# Patient Record
Sex: Male | Born: 1980 | Race: White | Hispanic: No | Smoking: Former smoker
Health system: Southern US, Community
[De-identification: ages and names within clinical notes are randomized; demographics above are authoritative.]

## PROBLEM LIST (undated history)

## (undated) DIAGNOSIS — I8393 Asymptomatic varicose veins of bilateral lower extremities: Secondary | ICD-10-CM

## (undated) DIAGNOSIS — G43909 Migraine, unspecified, not intractable, without status migrainosus: Secondary | ICD-10-CM

## (undated) DIAGNOSIS — I10 Essential (primary) hypertension: Secondary | ICD-10-CM

## (undated) HISTORY — PX: HERNIA REPAIR: SHX51

## (undated) HISTORY — PX: APPENDECTOMY: SHX54

---

## 2001-02-04 ENCOUNTER — Other Ambulatory Visit: Admission: RE | Admit: 2001-02-04 | Discharge: 2001-02-04 | Payer: Self-pay | Admitting: Internal Medicine

## 2002-10-31 ENCOUNTER — Ambulatory Visit (HOSPITAL_COMMUNITY): Admission: RE | Admit: 2002-10-31 | Discharge: 2002-10-31 | Payer: Self-pay | Admitting: Family Medicine

## 2002-10-31 ENCOUNTER — Encounter: Payer: Self-pay | Admitting: Family Medicine

## 2004-05-04 ENCOUNTER — Emergency Department (HOSPITAL_COMMUNITY): Admission: EM | Admit: 2004-05-04 | Discharge: 2004-05-04 | Payer: Self-pay | Admitting: Emergency Medicine

## 2004-08-14 ENCOUNTER — Emergency Department (HOSPITAL_COMMUNITY): Admission: EM | Admit: 2004-08-14 | Discharge: 2004-08-14 | Payer: Self-pay | Admitting: Emergency Medicine

## 2007-10-13 ENCOUNTER — Observation Stay (HOSPITAL_COMMUNITY): Admission: RE | Admit: 2007-10-13 | Discharge: 2007-10-14 | Payer: Self-pay | Admitting: General Surgery

## 2007-10-13 ENCOUNTER — Encounter (INDEPENDENT_AMBULATORY_CARE_PROVIDER_SITE_OTHER): Payer: Self-pay | Admitting: General Surgery

## 2008-06-27 ENCOUNTER — Emergency Department (HOSPITAL_COMMUNITY): Admission: EM | Admit: 2008-06-27 | Discharge: 2008-06-27 | Payer: Self-pay | Admitting: Emergency Medicine

## 2011-01-07 NOTE — Op Note (Signed)
NAME:  Rodney Valdez, [name]               ACCOUNT NO.:  [id number]   MEDICAL RECORD NO.:  [id number]          PATIENT TYPE:  AMB   LOCATION:  DAY                           FACILITY:  [facility]   PHYSICIAN:  Barbaraann Barthel, M.D. DATE OF BIRTH:  [birth year]   DATE OF PROCEDURE:  10/13/2007  DATE OF DISCHARGE:                               OPERATIVE REPORT   SURGEON:  Barbaraann Barthel, M.D.   PREOPERATIVE DIAGNOSIS:  1. Left inguinal hernia.  2. Desire for sterilization.   POSTOPERATIVE DIAGNOSIS:  1. Left inguinal hernia.  2. Desire for sterilization.   PROCEDURE:  1. Left inguinal herniorrhaphy (modified McVay repair without mesh).  2. Bilateral vasectomy.   NOTE:  This is a 30 year old white male who came to my office for a  vasectomy, however, on physical examination he had a rather large left  inguinal hernia that was reducible.  However, was really quite large. I  advised him to take care of both at the same surgery.  We discussed  complications not limited to but including bleeding, infection and the  recurrence of the hernia as well as the possibility of spontaneous  reapproximation of the vas deferens however unlikely that was.  We also  told him that he should not have any sexual activity until we checked a  sperm count.  Informed consent was obtained.   GROSS OPERATIVE FINDINGS:  The patient had a very large left inguinal  hernia with the sigmoid colon within the hernia sac.  Normal vas  deferens on either side.  The vasectomy was performed with the inguinal  incision on the left side, obviously, and through a scrotal incision on  the right.   TECHNIQUE:  The patient was placed in a supine position. After the  adequate administration of LMA anesthesia, his groin area was prepped  with Betadine solution and draped in the usual manner aseptically.  A  Foley catheter was placed.  An incision was carried out between the  anterior superior iliac spine and the pubic tubercle  through skin,  subcutaneous tissue, and Scarpa's layer down to the external oblique  which was opened at the external ring.  The cord structures were then  dissected free.  There was a massive inguinal hernia sac and this  contained the sigmoid colon.  We opened in the sac and reduced the  sigmoid colon back into the peritoneal cavity and then doubly ligated  the sac with 2-0 Bralon and amputated the redundant portion of the sac  with the cautery device.  I then approximated the inguinal floor which  was somewhat weak with 2-0 Bralon suture, the transversus abdominis and  transversalis fascia to Cooper's ligament and Poupart's ligament with  interrupted sutures.  Prior to cinching these tightly, a relaxing  incision was carried out.  Then, attention was turned to the vas  deferens which I identified and removed approximately an inch portion of  which this was ligated on both ends and needle tip cautery device was  placed in then each lumen and the vas deferens was cauterized.  These  were, as stated, ligated  with 3-0 Novofil.  We then returned the cord  structures to their anatomic position and repaired the external oblique  over them, irrigated with normal saline solution, I used approximately  10 mL or more of 0.5% Sensorcaine for postoperative comfort, and then  approximated the skin with a stapling device.  We then changed gloves,  removed the Foley.  We prepped with Betadine solution and draped and I  made an incision in the right scrotum identifying the vas deferens and  ligating this doubly on either side removing about 1/2 inch or so of  specimen and cauterizing the lumen with the needle tip cautery device.  The scrotum was then approximated with a 4-0 subcuticular suture.  A 1/2  inch Steri-Strip was placed over the small incision.  Prior to this, an  OpSite dressing had been placed over the groin wound on the left side.  Prior to closure, all sponge, needle, and instrument counts  were found  to be correct.  Estimated blood loss was minimal.  The patient received  1200 mL of crystalloids intraoperatively.  No drains were placed.  There  were no complications.      Barbaraann Barthel, M.D.  Electronically Signed     WB/MEDQ  D:  10/13/2007  T:  10/13/2007  Job:  [id number]

## 2011-04-02 ENCOUNTER — Other Ambulatory Visit: Payer: Self-pay | Admitting: Anesthesiology

## 2011-04-02 DIAGNOSIS — M545 Low back pain: Secondary | ICD-10-CM

## 2011-04-07 ENCOUNTER — Inpatient Hospital Stay (HOSPITAL_COMMUNITY)
Admission: RE | Admit: 2011-04-07 | Discharge: 2011-04-07 | Payer: Self-pay | Source: Ambulatory Visit | Attending: Anesthesiology | Admitting: Anesthesiology

## 2011-05-16 LAB — DIFFERENTIAL
Basophils Absolute: 0.1
Basophils Relative: 1
Eosinophils Absolute: 0.5
Eosinophils Relative: 6 — ABNORMAL HIGH
Lymphocytes Relative: 33
Lymphs Abs: 2.7
Monocytes Absolute: 0.8
Monocytes Relative: 10
Neutro Abs: 4
Neutrophils Relative %: 50

## 2011-05-16 LAB — BASIC METABOLIC PANEL
BUN: 8
CO2: 31
Calcium: 9.8
Chloride: 100
Creatinine, Ser: 0.76
GFR calc Af Amer: >60
GFR calc non Af Amer: >60
Glucose, Bld: 79
Potassium: 4.1
Sodium: 136

## 2011-05-16 LAB — CBC
HCT: 45.1
Hemoglobin: 16
MCHC: 35.4
MCV: 86.8
Platelets: 291
RBC: 5.2
RDW: 13.6
WBC: 8

## 2011-05-27 LAB — STREP A DNA PROBE: Group A Strep Probe: NEGATIVE

## 2011-05-27 LAB — RAPID STREP SCREEN (MED CTR MEBANE ONLY): Streptococcus, Group A Screen (Direct): NEGATIVE

## 2012-12-15 ENCOUNTER — Emergency Department (HOSPITAL_COMMUNITY): Payer: Self-pay

## 2012-12-15 ENCOUNTER — Encounter (HOSPITAL_COMMUNITY): Payer: Self-pay

## 2012-12-15 ENCOUNTER — Emergency Department (HOSPITAL_COMMUNITY)
Admission: EM | Admit: 2012-12-15 | Discharge: 2012-12-15 | Disposition: A | Discharge status: Home / Self Care | Payer: Self-pay | Attending: Emergency Medicine | Admitting: Emergency Medicine

## 2012-12-15 DIAGNOSIS — Z8701 Personal history of pneumonia (recurrent): Secondary | ICD-10-CM | POA: Insufficient documentation

## 2012-12-15 DIAGNOSIS — I809 Phlebitis and thrombophlebitis of unspecified site: Secondary | ICD-10-CM

## 2012-12-15 DIAGNOSIS — F17200 Nicotine dependence, unspecified, uncomplicated: Secondary | ICD-10-CM | POA: Insufficient documentation

## 2012-12-15 DIAGNOSIS — I10 Essential (primary) hypertension: Secondary | ICD-10-CM | POA: Insufficient documentation

## 2012-12-15 DIAGNOSIS — I8000 Phlebitis and thrombophlebitis of superficial vessels of unspecified lower extremity: Secondary | ICD-10-CM | POA: Insufficient documentation

## 2012-12-15 HISTORY — DX: Essential (primary) hypertension: I10

## 2012-12-15 MED ORDER — OXYCODONE-ACETAMINOPHEN 5-325 MG PO TABS: 2.0000 | ORAL_TABLET | ORAL | Status: DC | PRN

## 2012-12-15 NOTE — ED Provider Notes (Signed)
History  This chart was scribed for Donnetta Hutching, MD by Bennett Scrape, ED Scribe. This patient was seen in room [facility] and the patient's care was started at 10:06 AM.  CSN: [id number]  Arrival date & time 12/15/12  1000   First MD Initiated Contact with Patient 12/15/12 1006      Chief Complaint  Patient presents with  . Leg Pain     The history is provided by the patient. No language interpreter was used.    Rodney Valdez is a 32 y.o. male with a h/o 6 prior superficial thrombophlebiti last year who presents to the Emergency Department complaining of 9 days of gradual onset, gradually worsening, constant right leg pain with 2 days of associated difficulty bending the right knee. The symptoms last year were treated briefly with coumadin for 2 months and elevation of the right leg. He denies CP, SOB, and abdominal pain as associated symptoms. He has a h/o HTN and is on lisinopril. Pt is a current everyday smoker but denies alcohol use.  PCP is Dr. Sherwood Gambler.  Past Medical History  Diagnosis Date  . Blood clot in vein   . Hypertension     Past Surgical History  Procedure Laterality Date  . Hernia repair      No family history on file.  History  Substance Use Topics  . Smoking status: Current Every Day Smoker  . Smokeless tobacco: Not on file  . Alcohol Use: No      Review of Systems  A complete 10 system review of systems was obtained and all systems are negative except as noted in the HPI and PMH.   Allergies  Review of patient's allergies indicates no known allergies.  Home Medications  No current outpatient prescriptions on file.  Triage Vitals: BP 145/92  Pulse 69  Temp(Src) 98 F (36.7 C) (Oral)  Resp 18  Ht 5\' 7"  (1.702 m)  Wt 195 lb (88.451 kg)  BMI 30.53 kg/m2  SpO2 100%  Physical Exam  Nursing note and vitals reviewed. Constitutional: He is oriented to person, place, and time. He appears well-developed and well-nourished. No distress.   HENT:  Head: Normocephalic and atraumatic.  Eyes: EOM are normal.  Neck: Neck supple. No tracheal deviation present.  Cardiovascular: Normal rate.   Pulmonary/Chest: Effort normal. No respiratory distress.  Musculoskeletal: Normal range of motion. He exhibits tenderness (soreness to right posterior thigh and right posterior calf).  Multiple veracities to posterior right calf  Neurological: He is alert and oriented to person, place, and time.  Skin: Skin is warm and dry.  Psychiatric: He has a normal mood and affect. His behavior is normal.    ED Course  Procedures (including critical care time)  DIAGNOSTIC STUDIES: Oxygen Saturation is 100% on room air, normal by my interpretation.    COORDINATION OF CARE: 10:23 AM-Discussed treatment plan which includes doppler with pt at bedside and pt agreed to plan.   Labs Reviewed - No data to display  US Venous Img Lower Unilateral Right  12/15/2012  *RADIOLOGY REPORT*  Clinical Data: Posterior calf and thigh pain.  RIGHT LOWER EXTREMITY VENOUS DUPLEX ULTRASOUND  Technique: Gray-scale sonography with graded compression, as well as color Doppler and duplex ultrasound, were performed to evaluate the deep venous system of the lower extremity on the right side from the level of the common femoral vein through the popliteal and proximal calf veins. Spectral Doppler was utilized to evaluate flow at rest and with distal augmentation maneuvers.  Comparison: None  Findings:  Normal compressibility of the right common femoral, superficial femoral, and popliteal veins is demonstrated, as well as the visualized proximal calf veins.  Doppler waveforms show normal direction of venous flow, normal respiratory phasicity and response to augmentation in the deep veins.  There is superficial venous thrombosis which is partially occlusive in the greater saphenous vein in the thigh, and completely occlusive in the greater saphenous vein distally in the calf. Varicosities  adjacent to the greater saphenous vein noted in the thigh.  IMPRESSION: 1.  No evidence of lower extremity deep vein thrombosis. However, there is partially occlusive superficial venous thrombosis in the greater saphenous vein in the thigh, and occlusive superficial venous thrombosis in the greater saphenous vein in the calf.   Original Report Authenticated By: Gaylyn Rong, M.D.      No diagnosis found.    MDM  Doppler study of right leg reveals no evidence of a DVT. However, there is evidence of both partially occlusive and occlusive superficial venous thrombosis.  Referral to vascular surgery.  Pain meds.  elevate      I personally performed the services described in this documentation, which was scribed in my presence. The recorded information has been reviewed and is accurate.    Donnetta Hutching, MD 12/15/12 1349

## 2012-12-15 NOTE — Discharge Instructions (Signed)
You do not have any deep venous thrombosis.  However you have serious varicose veins some of which have very poor blood flow.  Elevate legs. Pain medication. Take daily aspirin. Recommend referral to a vascular surgeon for vein stripping. Phone number given

## 2012-12-15 NOTE — ED Notes (Signed)
Pt c/o pain in r leg since last week.  Has history of blood clots.

## 2013-12-26 ENCOUNTER — Emergency Department (HOSPITAL_COMMUNITY)
Admission: EM | Admit: 2013-12-26 | Discharge: 2013-12-26 | Disposition: A | Discharge status: Home / Self Care | Payer: Worker's Compensation | Attending: Emergency Medicine | Admitting: Emergency Medicine

## 2013-12-26 ENCOUNTER — Emergency Department (HOSPITAL_COMMUNITY): Payer: Worker's Compensation

## 2013-12-26 ENCOUNTER — Encounter (HOSPITAL_COMMUNITY): Payer: Self-pay | Admitting: Emergency Medicine

## 2013-12-26 DIAGNOSIS — W108XXA Fall (on) (from) other stairs and steps, initial encounter: Secondary | ICD-10-CM | POA: Insufficient documentation

## 2013-12-26 DIAGNOSIS — S39012A Strain of muscle, fascia and tendon of lower back, initial encounter: Secondary | ICD-10-CM

## 2013-12-26 DIAGNOSIS — S20229A Contusion of unspecified back wall of thorax, initial encounter: Secondary | ICD-10-CM | POA: Insufficient documentation

## 2013-12-26 DIAGNOSIS — F17200 Nicotine dependence, unspecified, uncomplicated: Secondary | ICD-10-CM | POA: Insufficient documentation

## 2013-12-26 DIAGNOSIS — S335XXA Sprain of ligaments of lumbar spine, initial encounter: Secondary | ICD-10-CM | POA: Insufficient documentation

## 2013-12-26 DIAGNOSIS — Y9289 Other specified places as the place of occurrence of the external cause: Secondary | ICD-10-CM | POA: Insufficient documentation

## 2013-12-26 DIAGNOSIS — Y9389 Activity, other specified: Secondary | ICD-10-CM | POA: Insufficient documentation

## 2013-12-26 DIAGNOSIS — Z79899 Other long term (current) drug therapy: Secondary | ICD-10-CM | POA: Insufficient documentation

## 2013-12-26 DIAGNOSIS — I10 Essential (primary) hypertension: Secondary | ICD-10-CM | POA: Insufficient documentation

## 2013-12-26 DIAGNOSIS — Y990 Civilian activity done for income or pay: Secondary | ICD-10-CM | POA: Insufficient documentation

## 2013-12-26 MED ORDER — METHOCARBAMOL 500 MG PO TABS: 500.0000 mg | ORAL_TABLET | Freq: Three times a day (TID) | ORAL | Status: DC

## 2013-12-26 MED ORDER — DEXAMETHASONE 6 MG PO TABS: ORAL_TABLET | ORAL | Status: DC

## 2013-12-26 MED ORDER — HYDROCODONE-ACETAMINOPHEN 5-325 MG PO TABS: 1.0000 | ORAL_TABLET | ORAL | Status: DC | PRN

## 2013-12-26 NOTE — Discharge Instructions (Signed)
Your x-ray reveals some degenerative disc disease changes, but no acute fracture or subluxation. Please rest your back is much as possible. Please use Decadron and Robaxin daily. Use Norco for pain if needed. Robaxin and Norco may cause drowsiness, please use with caution. Muscle Strain A muscle strain is an injury that occurs when a muscle is stretched beyond its normal length. Usually a small number of muscle fibers are torn when this happens. Muscle strain is rated in degrees. First-degree strains have the least amount of muscle fiber tearing and pain. Second-degree and third-degree strains have increasingly more tearing and pain.  Usually, recovery from muscle strain takes 1 2 weeks. Complete healing takes 5 6 weeks.  CAUSES  Muscle strain happens when a sudden, violent force placed on a muscle stretches it too far. This may occur with lifting, sports, or a fall.  RISK FACTORS Muscle strain is especially common in athletes.  SIGNS AND SYMPTOMS At the site of the muscle strain, there may be:  Pain.  Bruising.  Swelling.  Difficulty using the muscle due to pain or lack of normal function. DIAGNOSIS  Your health care provider will perform a physical exam and ask about your medical history. TREATMENT  Often, the best treatment for a muscle strain is resting, icing, and applying cold compresses to the injured area.  HOME CARE INSTRUCTIONS   Use the PRICE method of treatment to promote muscle healing during the first 2 3 days after your injury. The PRICE method involves:  Protecting the muscle from being injured again.  Restricting your activity and resting the injured body part.  Icing your injury. To do this, put ice in a plastic bag. Place a towel between your skin and the bag. Then, apply the ice and leave it on from 15 20 minutes each hour. After the third day, switch to moist heat packs.  Apply compression to the injured area with a splint or elastic bandage. Be careful not to  wrap it too tightly. This may interfere with blood circulation or increase swelling.  Elevate the injured body part above the level of your heart as often as you can.  Only take over-the-counter or prescription medicines for pain, discomfort, or fever as directed by your health care provider.  Warming up prior to exercise helps to prevent future muscle strains. SEEK MEDICAL CARE IF:   You have increasing pain or swelling in the injured area.  You have numbness, tingling, or a significant loss of strength in the injured area. MAKE SURE YOU:   Understand these instructions.  Will watch your condition.  Will get help right away if you are not doing well or get worse. Document Released: 08/11/2005 Document Revised: 06/01/2013 Document Reviewed: 03/10/2013 [facility] Patient Information 2014 [place], [place].  Contusion A contusion is a deep bruise. Contusions are the result of an injury that caused bleeding under the skin. The contusion may turn blue, purple, or yellow. Minor injuries will give you a painless contusion, but more severe contusions may stay painful and swollen for a few weeks.  CAUSES  A contusion is usually caused by a blow, trauma, or direct force to an area of the body. SYMPTOMS   Swelling and redness of the injured area.  Bruising of the injured area.  Tenderness and soreness of the injured area.  Pain. DIAGNOSIS  The diagnosis can be made by taking a history and physical exam. An X-ray, CT scan, or MRI may be needed to determine if there were any associated  injuries, such as fractures. TREATMENT  Specific treatment will depend on what area of the body was injured. In general, the best treatment for a contusion is resting, icing, elevating, and applying cold compresses to the injured area. Over-the-counter medicines may also be recommended for pain control. Ask your caregiver what the best treatment is for your contusion. HOME CARE INSTRUCTIONS   Put ice on the  injured area.  Put ice in a plastic bag.  Place a towel between your skin and the bag.  Leave the ice on for 15-20 minutes, 03-04 times a day.  Only take over-the-counter or prescription medicines for pain, discomfort, or fever as directed by your caregiver. Your caregiver may recommend avoiding anti-inflammatory medicines (aspirin, ibuprofen, and naproxen) for 48 hours because these medicines may increase bruising.  Rest the injured area.  If possible, elevate the injured area to reduce swelling. SEEK IMMEDIATE MEDICAL CARE IF:   You have increased bruising or swelling.  You have pain that is getting worse.  Your swelling or pain is not relieved with medicines. MAKE SURE YOU:   Understand these instructions.  Will watch your condition.  Will get help right away if you are not doing well or get worse. Document Released: 05/21/2005 Document Revised: 11/03/2011 Document Reviewed: 06/16/2011 [facility] Patient Information 2014 [place], [place].

## 2013-12-26 NOTE — ED Notes (Signed)
Fell down 6-7 steps at work.  Pt says he was carrying something and fell down steps.  Back pain.  No LOC,  No neck pain.alert, talking.

## 2013-12-26 NOTE — ED Provider Notes (Signed)
CSN: [id number]     Arrival date & time 12/26/13  1355 History  This chart was scribed for non-physician practitioner, Lenox Ahr, PA-C, working with Maudry Diego, MD by Ladene Artist, ED Scribe. This patient was seen in room APFT24/[facility] and the patient's care was started at 3:44 PM.    Chief Complaint  Patient presents with  . Fall    Patient is a 33 y.o. male presenting with fall. The history is provided by the patient. No language interpreter was used.  Fall This is a new problem. The current episode started 6 to 12 hours ago. The problem occurs rarely. The problem has not changed since onset.The symptoms are aggravated by walking. Nothing relieves the symptoms. He has tried nothing for the symptoms.   HPI Comments: KAIL FRALEY is a 33 y.o. male who presents to the Emergency Department complaining of constant mid back pain onset between 7:30-8 AM today. Pt states that he was carrying something at work when he fell down 7-8 steps and landed on his back. Pt states that he tried to brace himself with his arms upon landing. Pain is worsened with walking. He also reports recently recovering from a pulled muscle in his back. Pt does not have a h/o back surgery.   Past Medical History  Diagnosis Date  . Blood clot in vein   . Hypertension    Past Surgical History  Procedure Laterality Date  . Hernia repair     History reviewed. No pertinent family history. History  Substance Use Topics  . Smoking status: Current Every Day Smoker  . Smokeless tobacco: Not on file  . Alcohol Use: No    Review of Systems  Musculoskeletal: Positive for back pain. Negative for neck pain.  Neurological: Negative for syncope.  All other systems reviewed and are negative.   Allergies  Review of patient's allergies indicates no known allergies.  Home Medications   Prior to Admission medications   Medication Sig Start Date End Date Taking? Authorizing Provider  oxyCODONE-acetaminophen  (PERCOCET) 5-325 MG per tablet Take 2 tablets by mouth every 4 (four) hours as needed for pain. 12/15/12   Nat Christen, MD  PRESCRIPTION MEDICATION Take 1 tablet by mouth daily. Blood pressure medication.    Historical Provider, MD   Triage Vitals: BP 136/104  Pulse 109  Temp(Src) 98.5 F (36.9 C) (Oral)  Resp 20  Ht 5' 7.5" (1.715 m)  Wt 195 lb (88.451 kg)  BMI 30.07 kg/m2  SpO2 100% Physical Exam  Nursing note and vitals reviewed. Constitutional: He is oriented to person, place, and time. He appears well-developed and well-nourished.  Non-toxic appearance. He does not appear ill. No distress.  HENT:  Head: Normocephalic and atraumatic.  Right Ear: External ear normal.  Left Ear: External ear normal.  Nose: Nose normal. No mucosal edema or rhinorrhea.  Mouth/Throat: Oropharynx is clear and moist and mucous membranes are normal. No dental abscesses or uvula swelling.  Eyes: Conjunctivae and EOM are normal. Pupils are equal, round, and reactive to light.  Neck: Normal range of motion and full passive range of motion without pain. Neck supple.  Cardiovascular: Normal rate, regular rhythm and normal heart sounds.  Exam reveals no gallop and no friction rub.   No murmur heard. Pulmonary/Chest: Effort normal and breath sounds normal. No respiratory distress. He has no wheezes. He has no rhonchi. He has no rales. He exhibits no tenderness and no crepitus.  Patient speaks in complete sentences.  Abdominal:  Soft. Normal appearance and bowel sounds are normal. He exhibits no distension. There is no tenderness. There is no rebound and no guarding.  Musculoskeletal: Normal range of motion. He exhibits tenderness. He exhibits no edema.  No palpable step-offs of lumbar spine Bilateral parapsinal tenderness and spasms No coccyx area tenderness   Neurological: He is alert and oriented to person, place, and time. He has normal strength. No cranial nerve deficit.  No motor or sensory deficits on lower  extremities  Skin: Skin is warm, dry and intact. No rash noted. No erythema. No pallor.  2 scratches on lower lumbar area  Psychiatric: He has a normal mood and affect. His speech is normal and behavior is normal. His mood appears not anxious.    ED Course  Procedures (including critical care time) DIAGNOSTIC STUDIES: Oxygen Saturation is 100% on RA, normal by my interpretation.    COORDINATION OF CARE: 3:48 PM-Discussed plan to obtain diagnostic radiology pt at bedside. Pt agreed to plan.   Labs Review Labs Reviewed - No data to display  Imaging Review Dg Lumbar Spine Complete  12/26/2013   CLINICAL DATA:  Fall.  Back pain.  EXAM: LUMBAR SPINE - COMPLETE 4+ VIEW  COMPARISON:  None.  FINDINGS: Mild dextroconvex curve of the lumbar spine may be positional or secondary to spasm. There is no displaced fracture the lumbar spine. Vertebral body height is preserved. There are no pars defects identified. Mild disc space narrowing is present at L4-L5 and L5-S1.  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 This report assumes 5 lumbar type vertebral bodies.  IMPRESSION: 1. Mild dextroconvex curve of the lumbar spine with the apex at L1-L2. This may be positional or secondary to spasm. 2. No fracture or acute osseous abnormality. 3. There is lumbosacral transitional anatomy. This report assumes that there are 5 lumbar type vertebral bodies. Recommend close correlation with radiographs if intervention is elected.   Electronically Signed   By: Dereck Ligas M.D.   On: 12/26/2013 16:24     EKG Interpretation None      MDM Patient sustained a fall down steps at his work. The patient states that he had previously and just recovered from a muscle related injury of his back. He is concerned that he may have reinjured the back or done something even worse.  X-ray of the lumbar spine reveals mild curvature of the spine being present.  There was question if this was from a birth defect, or secondary to spasm. There is no fracture or dislocation appreciated.  Patient treated with ice pack, Decadron, Norco, and Robaxin. Findings have been discussed with the patient. Patient given a work note to be off work for a few days in order to rest his back.    Final diagnoses:  None    *I have reviewed nursing notes, vital signs, and all appropriate lab and imaging results for this patient.**  I personally performed the services described in this documentation, which was scribed in my presence. The recorded information has been reviewed and is accurate.    Lenox Ahr, PA-C 12/26/13 1902

## 2013-12-27 NOTE — ED Provider Notes (Signed)
Medical screening examination/treatment/procedure(s) were performed by non-physician practitioner and as supervising physician I was immediately available for consultation/collaboration.   EKG Interpretation None        Maudry Diego, MD 12/27/13 1517

## 2014-01-18 ENCOUNTER — Emergency Department (HOSPITAL_COMMUNITY)
Admission: EM | Admit: 2014-01-18 | Discharge: 2014-01-18 | Disposition: A | Discharge status: Home / Self Care | Payer: Worker's Compensation | Attending: Emergency Medicine | Admitting: Emergency Medicine

## 2014-01-18 ENCOUNTER — Encounter (HOSPITAL_COMMUNITY): Payer: Self-pay | Admitting: Emergency Medicine

## 2014-01-18 DIAGNOSIS — I10 Essential (primary) hypertension: Secondary | ICD-10-CM | POA: Insufficient documentation

## 2014-01-18 DIAGNOSIS — F17200 Nicotine dependence, unspecified, uncomplicated: Secondary | ICD-10-CM | POA: Insufficient documentation

## 2014-01-18 DIAGNOSIS — IMO0002 Reserved for concepts with insufficient information to code with codable children: Secondary | ICD-10-CM | POA: Insufficient documentation

## 2014-01-18 DIAGNOSIS — S0993XA Unspecified injury of face, initial encounter: Secondary | ICD-10-CM | POA: Insufficient documentation

## 2014-01-18 DIAGNOSIS — Y990 Civilian activity done for income or pay: Secondary | ICD-10-CM | POA: Insufficient documentation

## 2014-01-18 DIAGNOSIS — S199XXA Unspecified injury of neck, initial encounter: Secondary | ICD-10-CM

## 2014-01-18 DIAGNOSIS — Y9389 Activity, other specified: Secondary | ICD-10-CM | POA: Insufficient documentation

## 2014-01-18 DIAGNOSIS — Y9289 Other specified places as the place of occurrence of the external cause: Secondary | ICD-10-CM | POA: Insufficient documentation

## 2014-01-18 DIAGNOSIS — W108XXA Fall (on) (from) other stairs and steps, initial encounter: Secondary | ICD-10-CM | POA: Insufficient documentation

## 2014-01-18 NOTE — ED Notes (Signed)
Pt reports fell down some steps at work approx 3 weeks ago and has pain in back.  Pt says has pain in mid back and neck.

## 2014-01-18 NOTE — ED Notes (Signed)
Pt reports was supposed to go to urgent care for workman's comp.

## 2014-01-25 ENCOUNTER — Ambulatory Visit (HOSPITAL_COMMUNITY)
Admission: RE | Admit: 2014-01-25 | Discharge: 2014-01-25 | Disposition: A | Discharge status: Home / Self Care | Payer: Worker's Compensation | Source: Ambulatory Visit | Attending: Preventative Medicine | Admitting: Preventative Medicine

## 2014-01-25 NOTE — Evaluation (Signed)
Physical Therapy Evaluation  Patient Details  Name: Rodney Valdez MRN: [id number] Date of Birth: [birth year]  Today's Date: 01/25/2014 Time: 1730-1820 PT Time Calculation (min): 50 min    Charges: 1 Eval Manual therapy 1750-1800, TherEx 1800-1820          Visit#: 1 of 3  Re-eval:   Assessment Diagnosis: cerviothoracic stiffness.  Next MD Visit: Next week Prior Therapy: no  Authorization: Workman's comp    Authorization Visit#: 1 of 3   Past Medical History:  Past Medical History  Diagnosis Date  . Blood clot in vein   . Hypertension    Past Surgical History:  Past Surgical History  Procedure Laterality Date  . Hernia repair      Subjective Symptoms/Limitations Symptoms: pain only in low cervical upper throacic spine centralized, patient states increased soreness with restign, no dififcuty with any activities just pain soreness that comes and goes. currently no liftign or pulling >10lb Pertinent History: Patient fell going down stairs on 12/26/13.  Repetition: Decreases Symptoms Patient Stated Goals: Dercrease the pain Pain Assessment Currently in Pain?: Yes Pain Score: 3  Pain Location: Neck Pain Orientation: Lower;Medial Pain Type: Acute pain Pain Onset: 1 to 4 weeks ago Pain Frequency: Intermittent Pain Relieving Factors: muscle relaxers, and anti inflammatories, moveing.  Effect of Pain on Daily Activities: difficulty resting.   Precautions/Restrictions  Precautions Precautions: Back Precaution Comments: no lifting tor pulling >15lb  Sensation/Coordination/Flexibility/Functional Tests Functional Tests Functional Tests: Posture: increased forwards heath, thoracic kyphosis excessive, and orward rounded shoulders,  Functional Tests: 3D thoracic spine excursion: Saggital plane WNL, Frontal plane within normal limits, Transversplane WNL.   Assessment RUE Strength RUE Overall Strength Comments: WNL Right Shoulder Horizontal ADduction: 4/5 LUE Strength LUE  Overall Strength Comments: WNL Left Shoulder Horizontal ADduction: 4/5 Cervical AROM Cervical - Right Side Bend: 52 Cervical - Left Side Bend: 33 Cervical - Right Rotation: 72 Cervical - Left Rotation: 68 Palpation Palpation: significant tenderness along C6 to T7 cental spinous process and paraslinal muscles, tenderness in upper apezius muscles  Exercise/Treatments Seated Other Seated Exercises: 3D thoracic spine excursion with shoulders crossed 10x Other Seated Exercises: 3D cervical spine excursion with UE distraction Stretches Other Shoulder Stretches: Posterior shoulder at doorway stretch 4x 10sconds Other Shoulder Stretches: 3way Pec stretch in door way 4x 10seconds  Manual therapy: Grade 1, 2, 3 cervico thoracic spine mobilizations and paraspinal soft tissue mobilization  Physical Therapy Assessment and Plan PT Assessment and Plan Clinical Impression Statement: Patient displays cervicothoracic pain follwoing a fall at work resultign in pain at C7-T3 central spinous processes and paraspinal muscles secodnary to limited shoulder , neck and thoracic spine mobility . Other contributing factors include postural limitations and decreased UE strength. Patient demsontrated a very positive response to intial treatmetn includign manaul therapy to decrease paraspinal msucl spasms and  cervicotothoraciic and shoulder exercises targeted at improving patient's AROM and posture.  Pt will benefit from skilled therapeutic intervention in order to improve on the following deficits: Decreased activity tolerance;Decreased range of motion;Decreased mobility;Decreased strength;Increased fascial restricitons;Increased muscle spasms Rehab Potential: Excellent Clinical Impairments Affecting Rehab Potential: Patient demosntrated a very positive response to initial treatment.  PT Frequency: Min 3X/week PT Duration: 4 weeks PT Treatment/Interventions: Functional mobility training;Therapeutic  activities;Therapeutic exercise;Manual techniques;Patient/family education PT Plan: Perform PT 26more tmes this week per MD orders. Initial focus on improvign patient's cervico throacic mobility and improving posture. Future focus on improvin scapular retractor strength.     Goals PT Short Term Goals Time to  Complete Short Term Goals:  (1 week) PT Short Term Goal 1: Patient will display improved Lt cervical side bending to greater than 50 degrees to be able to hold a phone against his shoudler and ear.  PT Short Term Goal 2: Patient will display no tenderness to palpation of thoracic paraspinal muscles.   PT Short Term Goal 3: Patient will display increased horizontal abduction strength to 5/5 to indicate improving posture with decreased forward round of shoulders.  PT Short Term Goal 4: Patient wll display zero pain for 24 hours with whil displaying improved seated and standing posture with shoulders no longer rounded forward and patient able to display thoracic kyphosis WNL   PT Short Term Goal 5: opatient iwll display zero pain for 24 hours with whil displaying improved seated and standing posture with shoulders no longer rounded forward and patient able to display thoracic kyphosis WNL    Problem List There are no active problems to display for this patient.   PT - End of Session Activity Tolerance: Patient tolerated treatment well General Behavior During Therapy: WFL for tasks assessed/performed PT Plan of Care PT Home Exercise Plan: Given: 3D Thoracic spine excursion with arms crossed, 3D cervical spine excursion with UE inferiorly distracted, 3 way pec stretch at door way, and posterior shoudler stretch at door way.   GP    Suzette Battiest Quintasha Gren PT DPT 01/25/2014, 6:45 PM  Physician Documentation Your signature is required to indicate approval of the treatment plan as stated above.  Please sign and either send electronically or make a copy of this report for your files and return this  physician signed original.   Please mark one 1.__approve of plan  2. ___approve of plan with the following conditions.   ______________________________                                                          _____________________ Physician Signature                                                                                                             Date

## 2014-01-26 ENCOUNTER — Ambulatory Visit (HOSPITAL_COMMUNITY)
Admission: RE | Admit: 2014-01-26 | Discharge: 2014-01-26 | Disposition: A | Discharge status: Home / Self Care | Payer: Worker's Compensation | Source: Ambulatory Visit | Attending: Internal Medicine | Admitting: Internal Medicine

## 2014-01-26 DIAGNOSIS — M546 Pain in thoracic spine: Secondary | ICD-10-CM | POA: Insufficient documentation

## 2014-01-26 DIAGNOSIS — M539 Dorsopathy, unspecified: Secondary | ICD-10-CM | POA: Insufficient documentation

## 2014-01-26 DIAGNOSIS — M542 Cervicalgia: Secondary | ICD-10-CM | POA: Insufficient documentation

## 2014-01-26 DIAGNOSIS — IMO0001 Reserved for inherently not codable concepts without codable children: Secondary | ICD-10-CM | POA: Insufficient documentation

## 2014-01-26 DIAGNOSIS — M6281 Muscle weakness (generalized): Secondary | ICD-10-CM | POA: Insufficient documentation

## 2014-01-26 DIAGNOSIS — I10 Essential (primary) hypertension: Secondary | ICD-10-CM | POA: Insufficient documentation

## 2014-01-26 NOTE — Progress Notes (Signed)
Physical Therapy Treatment Patient Details  Name: BRISCOE DANIELLO MRN: [id number] Date of Birth: 1981-06-12  Today's Date: 01/26/2014 Time: 1352-1430 PT Time Calculation (min): 38 min Charge: TE 1352-1430  Visit#: 2 of 3  Re-eval:   Assessment Diagnosis: cerviothoracic stiffness.  Next MD Visit: Jomarie Longs- next week Prior Therapy: no  Authorization: Workman's comp  Authorization Time Period:    Authorization Visit#: 2 of 3   Subjective: Symptoms/Limitations Symptoms: Pt stated he has been active today and feeling good, compliance with HEP without question. Pain Assessment Currently in Pain?: No/denies  Precautions/Restrictions  Precautions Precautions: Back Precaution Comments: no lifting tor pulling >15lb  Exercise/Treatments Seated Other Seated Exercises: 3D thoracic spine excursion with shoulders crossed 10x Other Seated Exercises: 3D cervical spine excursion with UE distraction Prone  Extension: Both;10 reps Other Prone Exercises: Row 10 Other Prone Exercises: Chin tuck head lift 5x 5" Standing Extension: Both;10 reps;Theraband Theraband Level (Shoulder Extension): Level 4 (Blue) Row: Both;10 reps;Theraband Theraband Level (Shoulder Row): Level 4 (Blue) Retraction: Both;10 reps;Theraband Theraband Level (Shoulder Retraction): Level 4 (Blue) Other Standing Exercises: Cervical retraction 10x 5"    Physical Therapy Assessment and Plan PT Assessment and Plan Clinical Impression Statement: Began PT POC for cervical and thoracic mobility and progressed to postural education and strengthening.  Improved cervical and thoracic mobilty to WNL, pt compliant with 3D excursion HEP exercises and able to demonstrate appropraite technique with all exercises today.  Began scapular retraction strengtheing exercises with min therapist faciliation requiired for form, pt able to demosntrate appropriate technique following cueing.  Pt given HEP worksheet and green tband to add to HEP.   Pain free with improved posture at end of session, pt able to verbalize appropriate posture landmarks.   PT Plan: Continue x 1 more session per MD order.  Continue initial focus on cervical and thoracic mobility and progress posture strengthening exercises.  Give pt HEP for prone posture strengthening exercises next session.  Begin SAR and horizontal abduction prone next session.      Goals PT Short Term Goals PT Short Term Goal 1: Patient will display improved Lt cervical side bending to greater than 50 degrees to be able to hold a phone against his shoudler and ear.  PT Short Term Goal 1 - Progress: Progressing toward goal PT Short Term Goal 2: Patient will display no tenderness to palpation of thoracic paraspinal muscles.   PT Short Term Goal 2 - Progress: Progressing toward goal PT Short Term Goal 3: Patient will display increased horizontal abduction strength to 5/5 to indicate improving posture with decreased forward round of shoulders.  PT Short Term Goal 4: Patient wll display zero pain for 24 hours with whil displaying improved seated and standing posture with shoulders no longer rounded forward and patient able to display thoracic kyphosis WNL   PT Short Term Goal 4 - Progress: Progressing toward goal  Problem List There are no active problems to display for this patient.   PT - End of Session Activity Tolerance: Patient tolerated treatment well General Behavior During Therapy: WFL for tasks assessed/performed PT Plan of Care PT Home Exercise Plan: Given: green theraband and worksheet for postural strengthening.  GP    Aldona Lento 01/26/2014, 4:58 PM

## 2014-01-27 ENCOUNTER — Ambulatory Visit (HOSPITAL_COMMUNITY)
Admission: RE | Admit: 2014-01-27 | Discharge: 2014-01-27 | Disposition: A | Discharge status: Home / Self Care | Payer: Worker's Compensation | Source: Ambulatory Visit | Attending: Preventative Medicine | Admitting: Preventative Medicine

## 2014-01-27 NOTE — Progress Notes (Signed)
Physical Therapy Re-evaluation/Treatment note/Discharge summary  Patient Details  Name: Rodney Valdez MRN: [id number] Date of Birth: [birth year]  Today's Date: 01/27/2014 Time: 1600-1640 PT Time Calculation (min): 40 min Charge: TE 3532-9924, MMT/ROM 2683-4196             Visit#: 3 of 3  Re-eval:   Assessment Diagnosis: cerviothoracic stiffness.  Next MD Visit: Jomarie Longs- 01/30/2014 Prior Therapy: no  Authorization: Workman's comp    Authorization Time Period:    Authorization Visit#: 3 of 3   Subjective Symptoms/Limitations Symptoms: Pain free today, a little tired from standing with work.  Reports he has felt better every session with PT/   Pain Assessment Currently in Pain?: No/denies  Precautions/Restrictions  Precautions Precautions: Back Precaution Comments: no lifting tor pulling >15lb  Objective:  Assessment RUE Strength RUE Overall Strength Comments: WNL Right Shoulder Horizontal ADduction: 4/5 (was 4/5) LUE Strength LUE Overall Strength Comments: WNL Left Shoulder Horizontal ADduction: 4/5 (was 4/5) Cervical AROM Cervical - Right Side Bend: 53 (was 52) Cervical - Left Side Bend: 52 (was 33) Cervical - Right Rotation: 80 (was 72) Cervical - Left Rotation: 78 (was 68) Palpation Palpation: No tenderness palpated cervical/ thoracic  Exercise/Treatments Seated Other Seated Exercises: 3D thoracic spine excursion with shoulders crossed 10x Other Seated Exercises: 3D cervical spine excursion with UE distraction Prone  Extension: Both;10 reps Horizontal ABduction 1: Both;10 reps Other Prone Exercises: Row 10 Other Prone Exercises: Chin tuck head lift 5x 5" Standing Extension: Both;10 reps;Theraband Theraband Level (Shoulder Extension): Level 4 (Blue) Row: Both;10 reps;Theraband Theraband Level (Shoulder Row): Level 4 (Blue) Retraction: Both;10 reps;Theraband Theraband Level (Shoulder Retraction): Level 4 (Blue) ROM / Strengthening / Isometric  Strengthening Wall Pushups: 10 reps;Limitations Wall Pushups Limitations: HEP Prot/Ret//Elev/Dep: 5x with therapist faciliation      Physical Therapy Assessment and Plan PT Assessment and Plan Clinical Impression Statement: Reviewed goals, MMT and ROM with the following findings:  Pt independent with HEP and able to demonstrate appropriate technqiue with all exercises.  Improved cervical ROM to WNL pain free.  No tenderness palpated throughout back.  Posture improving, pt able to verbalize appropriate posture landmarks, state the importance of proper posture and demonstrate through does require cueing to reduce forward rolled shoulders and forward head in his hyperkyphotic posture.  Strength progressing.  Pt given updated HEP for prone scapular strengthening exercises. PT Plan: D/C to HEP per MD order/3rd session with worker comp.    Goals PT Short Term Goals PT Short Term Goal 1: Patient will display improved Lt cervical side bending to greater than 50 degrees to be able to hold a phone against his shoudler and ear.  PT Short Term Goal 1 - Progress: Met PT Short Term Goal 2: Patient will display no tenderness to palpation of thoracic paraspinal muscles.   PT Short Term Goal 2 - Progress: Met PT Short Term Goal 3: Patient will display increased horizontal abduction strength to 5/5 to indicate improving posture with decreased forward round of shoulders.  PT Short Term Goal 3 - Progress: Progressing toward goal PT Short Term Goal 4: Patient wll display zero pain for 24 hours with whil displaying improved seated and standing posture with shoulders no longer rounded forward and patient able to display thoracic kyphosis WNL   PT Short Term Goal 4 - Progress: Partly met (Zero pain; Able to demonstrate/verbalize proper posture with cueing)  Problem List There are no active problems to display for this patient.   PT - End of Session Activity  Tolerance: Patient tolerated treatment  well General Behavior During Therapy: WFL for tasks assessed/performed PT Plan of Care PT Home Exercise Plan: Given: prone exercises HEP  GP    Aldona Lento 01/27/2014, 6:13 PM  Physician Documentation Your signature is required to indicate approval of the treatment plan as stated above.  Please sign and either send electronically or make a copy of this report for your files and return this physician signed original.   Please mark one 1.__approve of plan  2. ___approve of plan with the following conditions.   ______________________________                                                          _____________________ Physician Signature                                                                                                             Date

## 2014-01-29 NOTE — Progress Notes (Signed)
Physical Therapy Re-evaluation/Treatment note/Discharge summary  Patient Details  Name: Rodney Valdez MRN: [id number] Date of Birth: [birth year]  Today's Date: 01/27/2014 Time: 1600-1640 PT Time Calculation (min): 40 min Charge: TE 7943-2761, MMT/ROM 4709-2957             Visit#: 3 of 3  Re-eval:   Assessment Diagnosis: cerviothoracic stiffness.  Next MD Visit: Jomarie Longs- 01/30/2014 Prior Therapy: no  Authorization: Workman's comp    Authorization Time Period:    Authorization Visit#: 3 of 3   Subjective Symptoms/Limitations Symptoms: Pain free today, a little tired from standing with work.  Reports he has felt better every session with PT/   Pain Assessment Currently in Pain?: No/denies  Precautions/Restrictions  Precautions Precautions: Back Precaution Comments: no lifting tor pulling >15lb  Objective:  Assessment RUE Strength RUE Overall Strength Comments: WNL Right Shoulder Horizontal ADduction: 4/5 (was 4/5) LUE Strength LUE Overall Strength Comments: WNL Left Shoulder Horizontal ADduction: 4/5 (was 4/5) Cervical AROM Cervical - Right Side Bend: 53 (was 52) Cervical - Left Side Bend: 52 (was 33) Cervical - Right Rotation: 80 (was 72) Cervical - Left Rotation: 78 (was 68) Palpation Palpation: No tenderness palpated cervical/ thoracic  Exercise/Treatments Seated Other Seated Exercises: 3D thoracic spine excursion with shoulders crossed 10x Other Seated Exercises: 3D cervical spine excursion with UE distraction Prone  Extension: Both;10 reps Horizontal ABduction 1: Both;10 reps Other Prone Exercises: Row 10 Other Prone Exercises: Chin tuck head lift 5x 5" Standing Extension: Both;10 reps;Theraband Theraband Level (Shoulder Extension): Level 4 (Blue) Row: Both;10 reps;Theraband Theraband Level (Shoulder Row): Level 4 (Blue) Retraction: Both;10 reps;Theraband Theraband Level (Shoulder Retraction): Level 4 (Blue) ROM / Strengthening / Isometric  Strengthening Wall Pushups: 10 reps;Limitations Wall Pushups Limitations: HEP Prot/Ret//Elev/Dep: 5x with therapist faciliation      Physical Therapy Assessment and Plan PT Assessment and Plan Clinical Impression Statement: Reviewed goals, MMT and ROM with the following findings:  Pt independent with HEP and able to demonstrate appropriate technqiue with all exercises.  Improved cervical ROM to WNL pain free.  No tenderness palpated throughout back.  Posture improving, pt able to verbalize appropriate posture landmarks, state the importance of proper posture and demonstrate through does require cueing to reduce forward rolled shoulders and forward head in his hyperkyphotic posture.  Strength progressing.  Pt given updated HEP for prone scapular strengthening exercises. PT Plan: D/C to HEP per MD order/3rd session with worker comp.    Goals PT Short Term Goals PT Short Term Goal 1: Patient will display improved Lt cervical side bending to greater than 50 degrees to be able to hold a phone against his shoudler and ear.  PT Short Term Goal 1 - Progress: Met PT Short Term Goal 2: Patient will display no tenderness to palpation of thoracic paraspinal muscles.   PT Short Term Goal 2 - Progress: Met PT Short Term Goal 3: Patient will display increased horizontal abduction strength to 5/5 to indicate improving posture with decreased forward round of shoulders.  PT Short Term Goal 3 - Progress: Progressing toward goal PT Short Term Goal 4: Patient wll display zero pain for 24 hours with whil displaying improved seated and standing posture with shoulders no longer rounded forward and patient able to display thoracic kyphosis WNL   PT Short Term Goal 4 - Progress: Partly met (Zero pain; Able to demonstrate/verbalize proper posture with cueing)  Problem List There are no active problems to display for this patient.   PT - End of Session Activity  Tolerance: Patient tolerated treatment  well General Behavior During Therapy: WFL for tasks assessed/performed PT Plan of Care PT Home Exercise Plan: Given: prone exercises HEP  GP    Aldona Lento 01/27/2014, 6:13 PM  Devona Konig DPT PT  Physician Documentation Your signature is required to indicate approval of the treatment plan as stated above.  Please sign and either send electronically or make a copy of this report for your files and return this physician signed original.   Please mark one 1.__approve of plan  2. ___approve of plan with the following conditions.   ______________________________                                                          _____________________ Physician Signature                                                                                                             Date

## 2014-04-15 ENCOUNTER — Emergency Department (HOSPITAL_COMMUNITY): Payer: Self-pay

## 2014-04-15 ENCOUNTER — Inpatient Hospital Stay (HOSPITAL_COMMUNITY)
Admission: EM | Admit: 2014-04-15 | Discharge: 2014-04-18 | DRG: 378 | Disposition: A | Discharge status: Home / Self Care | Payer: Self-pay | Attending: Internal Medicine | Admitting: Internal Medicine

## 2014-04-15 ENCOUNTER — Encounter (HOSPITAL_COMMUNITY): Payer: Self-pay | Admitting: Emergency Medicine

## 2014-04-15 DIAGNOSIS — K259 Gastric ulcer, unspecified as acute or chronic, without hemorrhage or perforation: Secondary | ICD-10-CM | POA: Diagnosis present

## 2014-04-15 DIAGNOSIS — K2970 Gastritis, unspecified, without bleeding: Secondary | ICD-10-CM

## 2014-04-15 DIAGNOSIS — K2990 Gastroduodenitis, unspecified, without bleeding: Secondary | ICD-10-CM

## 2014-04-15 DIAGNOSIS — K2980 Duodenitis without bleeding: Secondary | ICD-10-CM | POA: Diagnosis present

## 2014-04-15 DIAGNOSIS — R109 Unspecified abdominal pain: Secondary | ICD-10-CM

## 2014-04-15 DIAGNOSIS — K2960 Other gastritis without bleeding: Secondary | ICD-10-CM | POA: Diagnosis present

## 2014-04-15 DIAGNOSIS — I10 Essential (primary) hypertension: Secondary | ICD-10-CM

## 2014-04-15 DIAGNOSIS — R1030 Lower abdominal pain, unspecified: Secondary | ICD-10-CM

## 2014-04-15 DIAGNOSIS — K449 Diaphragmatic hernia without obstruction or gangrene: Secondary | ICD-10-CM | POA: Diagnosis present

## 2014-04-15 DIAGNOSIS — F17200 Nicotine dependence, unspecified, uncomplicated: Secondary | ICD-10-CM | POA: Diagnosis present

## 2014-04-15 DIAGNOSIS — D126 Benign neoplasm of colon, unspecified: Secondary | ICD-10-CM | POA: Diagnosis present

## 2014-04-15 DIAGNOSIS — K921 Melena: Principal | ICD-10-CM

## 2014-04-15 DIAGNOSIS — K922 Gastrointestinal hemorrhage, unspecified: Secondary | ICD-10-CM

## 2014-04-15 DIAGNOSIS — K222 Esophageal obstruction: Secondary | ICD-10-CM | POA: Diagnosis present

## 2014-04-15 DIAGNOSIS — D62 Acute posthemorrhagic anemia: Secondary | ICD-10-CM

## 2014-04-15 HISTORY — DX: Asymptomatic varicose veins of bilateral lower extremities: I83.93

## 2014-04-15 MED ORDER — SODIUM CHLORIDE 0.9 % IV SOLN
8.0000 mg/h | INTRAVENOUS | Status: DC
  Administered 2014-04-16: 8 mg/h via INTRAVENOUS
  Filled 2014-04-15 (×5): qty 80

## 2014-04-15 MED ORDER — SODIUM CHLORIDE 0.9 % IV BOLUS (SEPSIS)
1000.0000 mL | Freq: Once | INTRAVENOUS | Status: AC
  Administered 2014-04-16: 1000 mL via INTRAVENOUS

## 2014-04-15 MED ORDER — MORPHINE SULFATE 4 MG/ML IJ SOLN
4.0000 mg | Freq: Once | INTRAMUSCULAR | Status: AC
  Administered 2014-04-15: 4 mg via INTRAVENOUS
  Filled 2014-04-15: qty 1

## 2014-04-15 MED ORDER — SODIUM CHLORIDE 0.9 % IV SOLN
80.0000 mg | Freq: Once | INTRAVENOUS | Status: AC
  Administered 2014-04-16: 80 mg via INTRAVENOUS
  Filled 2014-04-15: qty 80

## 2014-04-15 MED ORDER — ONDANSETRON HCL 4 MG/2ML IJ SOLN
4.0000 mg | Freq: Once | INTRAMUSCULAR | Status: AC
  Administered 2014-04-15: 4 mg via INTRAVENOUS
  Filled 2014-04-15: qty 2

## 2014-04-15 MED ORDER — PANTOPRAZOLE SODIUM 40 MG IV SOLR
INTRAVENOUS | Status: AC
  Filled 2014-04-15: qty 160

## 2014-04-15 MED ORDER — IOHEXOL 300 MG/ML  SOLN: 50.0000 mL | Freq: Once | INTRAMUSCULAR | Status: AC | PRN

## 2014-04-15 NOTE — ED Provider Notes (Signed)
CSN: [id number]     Arrival date & time 04/15/14  2131 History   First MD Initiated Contact with Patient 04/15/14 2309    This chart was scribed for Julianne Rice, MD by Terressa Koyanagi, ED Scribe. This patient was seen in room [facility] and the patient's care was started at 11:25 PM.  Chief Complaint  Patient presents with  . Rectal Bleeding  PCP: Glo Herring., MD  The history is provided by the patient. No language interpreter was used.   HPI Comments: Rodney Valdez is a 33 y.o. male, with medical Hx noted below, who presents to the Emergency Department complaining of hematochezia. Pt reports that he had 5 bowel movements today that involved stool with "blackish, tar like" appearance. Pt also complains of associated constant abd pain onset today. Pt describes his pain as a cramping pain and rates it a 5 out of 10. Pt notes that he also had some nausea upon arrival to the ED, which has now resolved.Pt denies taking aspirin, goody powders or any other NSAID regularly; dizziness; light headedness; rash. Pt denies eating anything out of the ordinary or any recent travels.   Past Medical History  Diagnosis Date  . Blood clot in vein   . Hypertension    Past Surgical History  Procedure Laterality Date  . Hernia repair     History reviewed. No pertinent family history. History  Substance Use Topics  . Smoking status: Current Every Day Smoker  . Smokeless tobacco: Not on file  . Alcohol Use: No    Review of Systems  Constitutional: Negative for fever and chills.  Respiratory: Negative for shortness of breath.   Cardiovascular: Negative for chest pain, palpitations and leg swelling.  Gastrointestinal: Positive for nausea, abdominal pain and blood in stool. Negative for vomiting, diarrhea, constipation, abdominal distention and rectal pain.  Musculoskeletal: Negative for back pain, neck pain and neck stiffness.  Skin: Negative for rash and wound.  Neurological: Positive for  dizziness and light-headedness. Negative for syncope, weakness, numbness and headaches.  All other systems reviewed and are negative.     Allergies  Review of patient's allergies indicates no known allergies.  Home Medications   Prior to Admission medications   Medication Sig Start Date End Date Taking? Authorizing Provider  dexamethasone (DECADRON) 6 MG tablet 1 PO BID WITH FOOD 12/26/13   Lenox Ahr, PA-C  HYDROcodone-acetaminophen (NORCO/VICODIN) 5-325 MG per tablet Take 1 tablet by mouth daily as needed for moderate pain.    Historical Provider, MD  HYDROcodone-acetaminophen (NORCO/VICODIN) 5-325 MG per tablet Take 1 tablet by mouth every 4 (four) hours as needed for moderate pain. 12/26/13   Lenox Ahr, PA-C  methocarbamol (ROBAXIN) 500 MG tablet Take 1 tablet (500 mg total) by mouth 3 (three) times daily. 12/26/13   Lenox Ahr, PA-C   Triage Vitals: BP 146/88  Pulse 100  Temp(Src) 98.8 F (37.1 C) (Oral)  Resp 20  Ht 5' 7.5" (1.715 m)  Wt 195 lb (88.451 kg)  BMI 30.07 kg/m2  SpO2 100% Physical Exam  Nursing note and vitals reviewed. Constitutional: He is oriented to person, place, and time. He appears well-developed and well-nourished. No distress.  HENT:  Head: Normocephalic and atraumatic.  Mouth/Throat: Oropharynx is clear and moist.  Eyes: EOM are normal. Pupils are equal, round, and reactive to light.  Neck: Normal range of motion. Neck supple.  Cardiovascular: Normal rate and regular rhythm.   Pulmonary/Chest: Effort normal and breath sounds normal. No  respiratory distress. He has no wheezes. He has no rales.  Abdominal: Soft. Bowel sounds are normal. He exhibits no distension and no mass. There is tenderness (tenderness to palpation bilateral lower quadrants.). There is no rebound and no guarding.  No enlarged hemorrhoids. Black stool on rectal exam. Heme positive.  Musculoskeletal: Normal range of motion. He exhibits no edema and no tenderness.   Neurological: He is alert and oriented to person, place, and time.  Patient is alert and oriented x3 with clear, goal oriented speech. Patient has 5/5 motor in all extremities. Sensation is intact to light touch. Patient has a normal gait and walks without assistance.   Skin: Skin is warm and dry. No rash noted. No erythema.  Psychiatric: He has a normal mood and affect. His behavior is normal.    ED Course  Procedures (including critical care time) DIAGNOSTIC STUDIES: Oxygen Saturation is 100% on RA, nl by my interpretation.    Labs Review Labs Reviewed  CBC  COMPREHENSIVE METABOLIC PANEL  TYPE AND SCREEN    Imaging Review No results found.   EKG Interpretation None      MDM   Final diagnoses:  None   I personally performed the services described in this documentation, which was scribed in my presence. The recorded information has been reviewed and is accurate.  Discussed with [facility] hospitalist and will admit. Vital signs remained stable in the emergency department.   Julianne Rice, MD 04/16/14 [phone]

## 2014-04-15 NOTE — ED Notes (Signed)
Have past bright and dark red blood in my stool today 5 times per pt.

## 2014-04-16 ENCOUNTER — Encounter (HOSPITAL_COMMUNITY)
Admission: EM | Disposition: A | Discharge status: Home / Self Care | Payer: Self-pay | Source: Home / Self Care | Attending: Family Medicine

## 2014-04-16 ENCOUNTER — Encounter (HOSPITAL_COMMUNITY): Payer: Self-pay | Admitting: *Deleted

## 2014-04-16 DIAGNOSIS — D62 Acute posthemorrhagic anemia: Secondary | ICD-10-CM | POA: Diagnosis present

## 2014-04-16 DIAGNOSIS — R109 Unspecified abdominal pain: Secondary | ICD-10-CM | POA: Diagnosis present

## 2014-04-16 DIAGNOSIS — K922 Gastrointestinal hemorrhage, unspecified: Secondary | ICD-10-CM | POA: Diagnosis present

## 2014-04-16 DIAGNOSIS — K921 Melena: Principal | ICD-10-CM | POA: Diagnosis present

## 2014-04-16 DIAGNOSIS — I10 Essential (primary) hypertension: Secondary | ICD-10-CM | POA: Diagnosis present

## 2014-04-16 HISTORY — PX: GIVENS CAPSULE STUDY: SHX5432

## 2014-04-16 HISTORY — PX: ESOPHAGOGASTRODUODENOSCOPY: SHX5428

## 2014-04-16 LAB — CBC
HCT: 40.8 % (ref 39.0–52.0)
HCT: 35.9 % — ABNORMAL LOW (ref 39.0–52.0)
HCT: 36.8 % — ABNORMAL LOW (ref 39.0–52.0)
HEMOGLOBIN: 12.4 g/dL — AB (ref 13.0–17.0)
HEMOGLOBIN: 13 g/dL (ref 13.0–17.0)
Hemoglobin: 14.2 g/dL (ref 13.0–17.0)
MCH: 30.1 pg (ref 26.0–34.0)
MCH: 29.9 pg (ref 26.0–34.0)
MCH: 30.4 pg (ref 26.0–34.0)
MCHC: 34.8 g/dL (ref 30.0–36.0)
MCHC: 34.5 g/dL (ref 30.0–36.0)
MCHC: 35.3 g/dL (ref 30.0–36.0)
MCV: 86.4 fL (ref 78.0–100.0)
MCV: 86.5 fL (ref 78.0–100.0)
MCV: 86.2 fL (ref 78.0–100.0)
PLATELETS: 232 10*3/uL (ref 150–400)
PLATELETS: 227 10*3/uL (ref 150–400)
Platelets: 267 10*3/uL (ref 150–400)
RBC: 4.72 MIL/uL (ref 4.22–5.81)
RBC: 4.15 MIL/uL — ABNORMAL LOW (ref 4.22–5.81)
RBC: 4.27 MIL/uL (ref 4.22–5.81)
RDW: 13.2 % (ref 11.5–15.5)
RDW: 13.2 % (ref 11.5–15.5)
RDW: 13.3 % (ref 11.5–15.5)
WBC: 9.3 10*3/uL (ref 4.0–10.5)
WBC: 8.3 10*3/uL (ref 4.0–10.5)
WBC: 7.3 10*3/uL (ref 4.0–10.5)

## 2014-04-16 LAB — BASIC METABOLIC PANEL
ANION GAP: 9 (ref 5–15)
BUN: 26 mg/dL — ABNORMAL HIGH (ref 6–23)
CHLORIDE: 109 meq/L (ref 96–112)
CO2: 25 mEq/L (ref 19–32)
CREATININE: 0.83 mg/dL (ref 0.50–1.35)
Calcium: 8.6 mg/dL (ref 8.4–10.5)
GFR calc non Af Amer: >90 mL/min (ref >90)
Glucose, Bld: 78 mg/dL (ref 70–99)
Potassium: 4 mEq/L (ref 3.7–5.3)
Sodium: 143 mEq/L (ref 137–147)

## 2014-04-16 LAB — COMPREHENSIVE METABOLIC PANEL
ALT: 9 U/L (ref 0–53)
AST: 13 U/L (ref 0–37)
Albumin: 4 g/dL (ref 3.5–5.2)
Alkaline Phosphatase: 51 U/L (ref 39–117)
Anion gap: 12 (ref 5–15)
BUN: 36 mg/dL — ABNORMAL HIGH (ref 6–23)
CO2: 27 mEq/L (ref 19–32)
Calcium: 9.3 mg/dL (ref 8.4–10.5)
Chloride: 104 mEq/L (ref 96–112)
Creatinine, Ser: 0.76 mg/dL (ref 0.50–1.35)
GFR calc Af Amer: >90 mL/min (ref >90)
GFR calc non Af Amer: >90 mL/min (ref >90)
Glucose, Bld: 76 mg/dL (ref 70–99)
Potassium: 3.7 mEq/L (ref 3.7–5.3)
Sodium: 143 mEq/L (ref 137–147)
Total Bilirubin: 0.5 mg/dL (ref 0.3–1.2)
Total Protein: 7.1 g/dL (ref 6.0–8.3)

## 2014-04-16 LAB — LIPASE, BLOOD: Lipase: 21 U/L (ref 11–59)

## 2014-04-16 LAB — APTT: aPTT: 32 seconds (ref 24–37)

## 2014-04-16 LAB — PROTIME-INR
INR: 1.05 (ref 0.00–1.49)
INR: 1.18 (ref 0.00–1.49)
PROTHROMBIN TIME: 15 s (ref 11.6–15.2)
Prothrombin Time: 13.7 seconds (ref 11.6–15.2)

## 2014-04-16 LAB — TYPE AND SCREEN
ABO/RH(D): A POS
Antibody Screen: NEGATIVE

## 2014-04-16 SURGERY — EGD (ESOPHAGOGASTRODUODENOSCOPY)
Anesthesia: Moderate Sedation

## 2014-04-16 MED ORDER — HYDROMORPHONE HCL PF 1 MG/ML IJ SOLN
1.0000 mg | INTRAMUSCULAR | Status: DC | PRN
  Administered 2014-04-16 – 2014-04-18 (×11): 1 mg via INTRAVENOUS
  Filled 2014-04-16 (×11): qty 1

## 2014-04-16 MED ORDER — SODIUM CHLORIDE 0.9 % IV SOLN
8.0000 mg/h | Freq: Once | INTRAVENOUS | Status: AC
  Administered 2014-04-16: 8 mg/h via INTRAVENOUS
  Filled 2014-04-16: qty 80

## 2014-04-16 MED ORDER — SODIUM CHLORIDE 0.9 % IV SOLN
INTRAVENOUS | Status: AC
  Administered 2014-04-16 (×2): via INTRAVENOUS

## 2014-04-16 MED ORDER — STERILE WATER FOR IRRIGATION IR SOLN
Status: DC | PRN
  Administered 2014-04-16: 11:00:00

## 2014-04-16 MED ORDER — MEPERIDINE HCL 100 MG/ML IJ SOLN
INTRAMUSCULAR | Status: AC
  Filled 2014-04-16: qty 2

## 2014-04-16 MED ORDER — SODIUM CHLORIDE 0.9 % IV SOLN
INTRAVENOUS | Status: DC
  Administered 2014-04-16: 10:00:00 via INTRAVENOUS

## 2014-04-16 MED ORDER — PROMETHAZINE HCL 25 MG/ML IJ SOLN
INTRAMUSCULAR | Status: DC | PRN
  Administered 2014-04-16: 12.5 mg via INTRAVENOUS

## 2014-04-16 MED ORDER — MEPERIDINE HCL 100 MG/ML IJ SOLN
INTRAMUSCULAR | Status: DC | PRN
  Administered 2014-04-16: 50 mg via INTRAVENOUS
  Administered 2014-04-16 (×2): 25 mg via INTRAVENOUS

## 2014-04-16 MED ORDER — PROMETHAZINE HCL 25 MG/ML IJ SOLN
INTRAMUSCULAR | Status: AC
  Filled 2014-04-16: qty 1

## 2014-04-16 MED ORDER — SODIUM CHLORIDE 0.9 % IV SOLN: INTRAVENOUS | Status: DC

## 2014-04-16 MED ORDER — ONDANSETRON HCL 4 MG PO TABS: 4.0000 mg | ORAL_TABLET | Freq: Four times a day (QID) | ORAL | Status: DC | PRN

## 2014-04-16 MED ORDER — LIDOCAINE VISCOUS 2 % MT SOLN
OROMUCOSAL | Status: DC | PRN
  Administered 2014-04-16: 1 via OROMUCOSAL

## 2014-04-16 MED ORDER — METOCLOPRAMIDE HCL 5 MG/ML IJ SOLN
10.0000 mg | Freq: Once | INTRAMUSCULAR | Status: AC
  Administered 2014-04-16: 10 mg via INTRAVENOUS
  Filled 2014-04-16: qty 2

## 2014-04-16 MED ORDER — PROMETHAZINE HCL 25 MG/ML IJ SOLN
12.5000 mg | Freq: Once | INTRAMUSCULAR | Status: AC
  Administered 2014-04-16: 12.5 mg via INTRAVENOUS
  Filled 2014-04-16: qty 1

## 2014-04-16 MED ORDER — IOHEXOL 300 MG/ML  SOLN
100.0000 mL | Freq: Once | INTRAMUSCULAR | Status: AC | PRN
  Administered 2014-04-16: 100 mL via INTRAVENOUS

## 2014-04-16 MED ORDER — SODIUM CHLORIDE 0.9 % IJ SOLN
INTRAMUSCULAR | Status: AC
  Filled 2014-04-16: qty 30

## 2014-04-16 MED ORDER — PANTOPRAZOLE SODIUM 40 MG PO TBEC
40.0000 mg | DELAYED_RELEASE_TABLET | Freq: Two times a day (BID) | ORAL | Status: DC
  Administered 2014-04-17 (×2): 40 mg via ORAL
  Filled 2014-04-16 (×2): qty 1

## 2014-04-16 MED ORDER — MIDAZOLAM HCL 5 MG/5ML IJ SOLN
INTRAMUSCULAR | Status: AC
  Filled 2014-04-16: qty 10

## 2014-04-16 MED ORDER — ONDANSETRON HCL 4 MG/2ML IJ SOLN: 4.0000 mg | Freq: Four times a day (QID) | INTRAMUSCULAR | Status: DC | PRN

## 2014-04-16 MED ORDER — MIDAZOLAM HCL 5 MG/5ML IJ SOLN
INTRAMUSCULAR | Status: DC | PRN
  Administered 2014-04-16: 2 mg via INTRAVENOUS
  Administered 2014-04-16: 1 mg via INTRAVENOUS
  Administered 2014-04-16: 2 mg via INTRAVENOUS
  Administered 2014-04-16 (×2): 1 mg via INTRAVENOUS

## 2014-04-16 MED ORDER — LIDOCAINE VISCOUS 2 % MT SOLN
OROMUCOSAL | Status: AC
  Filled 2014-04-16: qty 15

## 2014-04-16 MED ORDER — IOHEXOL 300 MG/ML  SOLN
50.0000 mL | Freq: Once | INTRAMUSCULAR | Status: AC | PRN
  Administered 2014-04-16: 50 mL via ORAL

## 2014-04-16 MED ORDER — SODIUM CHLORIDE 0.9 % IJ SOLN
INTRAMUSCULAR | Status: AC
  Filled 2014-04-16: qty 10

## 2014-04-16 NOTE — Progress Notes (Signed)
  PROGRESS NOTE  Rodney Valdez [id number]:[id number] DOB: 12/12/1980 DOA: 04/15/2014 PCP: Glo Herring., MD  Summary: 33yom presented with abd pain, dark stools, admitted for GIB. CT ab/pelvis showed diverticulosis but otherwise unremarkable.   Assessment/Plan: 1. GIB, source unclear, suspect upper. Hemodynamics stable. 2. ABLA. Hgb has trended down, no active bleeding.   Remain n.p.o. Further evaluation planned by GI today, discussed with Dr. Oneida Alar.  Continue PPI infusion, NPO  Trend Hgb, no indication for transfusion at this point  Code Status: full code DVT prophylaxis: SCDs Family Communication: none present Disposition Plan: home when improved  Murray Hodgkins, MD  [facility]  Pager [phone] If 7PM-7AM, please contact night-coverage at [email or web address], password [email or web address] 04/16/2014, 9:22 AM  LOS: 1 day   Consultants:  GI  Procedures:    Antibiotics:    HPI/Subjective: Some abd discomfort.  Objective: Filed Vitals:   04/15/14 2143 04/16/14 0041 04/16/14 0353 04/16/14 0917  BP: 146/88 131/102 126/72 132/85  Pulse: 100 66 57 64  Temp: 98.8 F (37.1 C)  97.8 F (36.6 C) 98.2 F (36.8 C)  TempSrc: Oral  Oral Oral  Resp: 20 20 17 20   Height: 5' 7.5" (1.715 m)     Weight: 88.451 kg (195 lb)     SpO2: 100% 100% 100% 100%   No intake or output data in the 24 hours ending 04/16/14 0922   Filed Weights   04/15/14 2143  Weight: 88.451 kg (195 lb)    Exam:     Afebrile, VSS, no hypoxia Gen. Appears calm, comfortable.  Psych. Alert. Speech fluent and clear.  Cardiovascular. Regular rate and rhythm. No murmur, rub or gallop.  Respiratory. Clear to auscultation bilaterally. No wheezes, rales or rhonchi. Normal respiratory effort.  Abdomen. Soft.  Data Reviewed:  BMP unremarkable  Hgb down to 12.4  coags normal  CT ab/pelvis with diverticulosis  Scheduled Meds:  Continuous Infusions: . sodium chloride    . pantoprozole (PROTONIX) infusion  8 mg/hr (04/16/14 0026)    Principal Problem:   GI bleed Active Problems:   Melena   Abdominal pain, other specified site   Hypertension   Time spent 20 minutes

## 2014-04-16 NOTE — H&P (Signed)
PCP:   Glo Herring., MD   Chief Complaint:  Dark stool, abd pain  HPI: 33 yo male healthy comes in with 2 days of worsening abdominal pain mainly in both lower quadrants with dark loose stools sometimes maroonish in color.  Some nausea no vomiting.  No fevers.  Sometimes the pain is epigastric, but the pain in the lower quadrants is much worse.  No sick contacts.  No h/o pud or gib in past.  On edp rectal exam stool was melanotic.  Review of Systems:  Positive and negative as per HPI otherwise all other systems are negative  Past Medical History: Past Medical History  Diagnosis Date  . Blood clot in vein   . Hypertension    Past Surgical History  Procedure Laterality Date  . Hernia repair      Medications: Prior to Admission medications   Medication Sig Start Date End Date Taking? Authorizing Provider  dexamethasone (DECADRON) 6 MG tablet 1 PO BID WITH FOOD 12/26/13   Lenox Ahr, PA-C  HYDROcodone-acetaminophen (NORCO/VICODIN) 5-325 MG per tablet Take 1 tablet by mouth daily as needed for moderate pain.    Historical Provider, MD  HYDROcodone-acetaminophen (NORCO/VICODIN) 5-325 MG per tablet Take 1 tablet by mouth every 4 (four) hours as needed for moderate pain. 12/26/13   Lenox Ahr, PA-C  methocarbamol (ROBAXIN) 500 MG tablet Take 1 tablet (500 mg total) by mouth 3 (three) times daily. 12/26/13   Lenox Ahr, PA-C    Allergies:  No Known Allergies  Social History:  reports that he has been smoking.  He does not have any smokeless tobacco history on file. He reports that he does not drink alcohol or use illicit drugs.  Family History: History reviewed. No pertinent family history.  Physical Exam: Filed Vitals:   04/15/14 2143 04/16/14 0041  BP: 146/88 131/102  Pulse: 100 66  Temp: 98.8 F (37.1 C)   TempSrc: Oral   Resp: 20 20  Height: 5' 7.5" (1.715 m)   Weight: 88.451 kg (195 lb)   SpO2: 100% 100%   General appearance: alert, cooperative and no  distress Head: Normocephalic, without obvious abnormality, atraumatic Eyes: negative Nose: Nares normal. Septum midline. Mucosa normal. No drainage or sinus tenderness. Neck: no JVD and supple, symmetrical, trachea midline Lungs: clear to auscultation bilaterally Heart: regular rate and rhythm, S1, S2 normal, no murmur, click, rub or gallop Abdomen: soft, ttp lower quadrants, pos bs, no r/g nonacute abd Extremities: extremities normal, atraumatic, no cyanosis or edema Pulses: 2+ and symmetric Skin: Skin color, texture, turgor normal. No rashes or lesions Neurologic: Grossly normal    Labs on Admission:   Recent Labs  04/15/14 2322  NA 143  K 3.7  CL 104  CO2 27  GLUCOSE 76  BUN 36*  CREATININE 0.76  CALCIUM 9.3    Recent Labs  04/15/14 2322  AST 13  ALT 9  ALKPHOS 51  BILITOT 0.5  PROT 7.1  ALBUMIN 4.0    Recent Labs  04/15/14 2322  LIPASE 21    Recent Labs  04/15/14 2322  WBC 9.3  HGB 14.2  HCT 40.8  MCV 86.4  PLT 267   Radiological Exams on Admission: Ct Abdomen Pelvis W Contrast  04/16/2014   CLINICAL DATA:  Lower abdominal pain.  GI bleed  EXAM: CT ABDOMEN AND PELVIS WITH CONTRAST  TECHNIQUE: Multidetector CT imaging of the abdomen and pelvis was performed using the standard protocol following bolus administration of intravenous contrast.  CONTRAST:  20mL OMNIPAQUE IOHEXOL 300 MG/ML SOLN, 182mL OMNIPAQUE IOHEXOL 300 MG/ML SOLN  COMPARISON:  None.  FINDINGS: BODY WALL: Fatty enlargement of the bilateral inguinal canals, suspect developing inguinal hernias.  LOWER CHEST: Unremarkable.  ABDOMEN/PELVIS:  Liver: There are few tiny low densities within the liver which are indeterminate but likely incidental.  Biliary: Cholelithiasis.  No gallbladder distention or inflammation.  Pancreas: Unremarkable.  Spleen: Unremarkable.  Adrenals: Unremarkable.  Kidneys and ureters: 4 mm stone in the upper pole right kidney. 3 mm calculus in the interpolar left kidney.   Bladder: Unremarkable.  Reproductive: Unremarkable.  Bowel: No obstruction. Distal colonic diverticulosis which is mild but notable did age.  Retroperitoneum: No mass or adenopathy.  Peritoneum: No ascites or pneumoperitoneum.  Vascular: No acute abnormality.  OSSEOUS: Remote L3 and L4 left transverse process fractures.  IMPRESSION: 1. No acute intra-abdominal findings. 2. Diverticulosis.  No other identifiable cause for GI bleeding. 3. Bilateral nonobstructive nephrolithiasis. 4. Cholelithiasis.   Electronically Signed   By: Jorje Guild M.D.   On: 04/16/2014 02:39    Assessment/Plan  33 yo male with melena, likely ugib  Principal Problem:   GI bleed- likely upper.  Place on protonix gtt.  Consult gi in am.  Keep npo for now and on ivf.  Provide some pain meds for abd pain, ct does not show anything concerning and his clinical picture is mixed and not typical for either pud or diverticular bleed...  Await gi recs.  H/h is normal so reassuring.   Active Problems:   Melena   Abdominal pain, other specified site   Hypertension  Admit to med surg.  Full code.    DAVID,RACHAL A 04/16/2014, 3:47 AM

## 2014-04-16 NOTE — Op Note (Addendum)
[facility] [address] [place], [address]   ENDOSCOPY PROCEDURE REPORT  PATIENT: Rodney, Valdez  MR#: [id number] BIRTHDATE: 26-Feb-1981 , 33  yrs. old GENDER: Male  ENDOSCOPIST: Barney Drain, MD REFERRED CZ:YSAYT Gerarda Fraction, M.D.  PROCEDURE DATE: 04/16/2014 PROCEDURE:   EGD, diagnostic  INDICATIONS:Melena.   LOWER ABDOMINAL PAIN. Rarely uses Goodys MEDICATIONS: PREOP: PROMETHAZINE 12.5 MG IV, Demerol 100 mg IV, Versed 7 mg IV, Promethazine (Phenergan) 12.5mg  IV TOPICAL ANESTHETIC:   Viscous Xylocaine  DESCRIPTION OF PROCEDURE:     Physical exam was performed.  Informed consent was obtained from the patient after explaining the benefits, risks, and alternatives to the procedure.  The patient was connected to the monitor and placed in the left lateral position.  Continuous oxygen was provided by nasal cannula and IV medicine administered through an indwelling cannula.  After administration of sedation, the patients esophagus was intubated and the EG-[id number] ([id number])  endoscope was advanced under direct visualization to the second portion of the duodenum.  The scope was removed slowly by carefully examining the color, texture, anatomy, and integrity of the mucosa on the way out.  The patient was recovered in endoscopy and discharged home in satisfactory condition.   ESOPHAGUS: The mucosa of the esophagus appeared normal.   STOMACH: Moderate erosive gastritis (inflammation) was found in the gastric antrum.   Three small non-bleeding clean-based ulcers were found in the gastric antrum.   DUODENUM: Mild duodenal inflammation was found in the duodenal bulb.   The duodenal mucosa showed no abnormalities in the 2nd part of the duodenum. COMPLICATIONS: PT AGITATED, CURSING, COMBATIVE, AND TRYING TO GET OF EXAM TABLE IN SPITE OF ADEQUATE SEDATION.  ENDOSCOPIC IMPRESSION: 1.   NO SOURCE FOR MELENA IDENTIFIED 2.   MODERATE Erosive gastritis  AND MILD DUODENITIS 3.    Three small non-bleeding ulcers  in the gastric antrum 4.  PT SWALLOWED GIVENS CAPSULE  RECOMMENDATIONS: WILL READ CAPSULE THIS PM OR ToMORROW AM NPO EXCEPT SIPS WITH MEDS/ICE CHIPS NEXT ENDOSCOPY WITH PROPOFOL PROTONIX GTT MAY BE CHANGED TO PO BID Check H pylori serology   REPEAT EXAM:   _______________________________ Lorrin MaisBarney Drain, MD 04/16/2014 11:34 AM Revised: 04/16/2014 11:34 AM      PATIENT NAME:  Rodney Valdez MR#: [id number]

## 2014-04-16 NOTE — Consult Note (Addendum)
Referring Provider: No ref. provider found Primary Care Physician:  Glo Herring., MD Primary Gastroenterologist:  Barney Drain  Reason for Consultation:  MELENA   Impression: ADMITTED WITH MELENA ETIOLOGY UNCLEAR: PUD, DIEULAFOY'S LESION, LESS LIKELY MECKEL'S DIVERTICULUM OR R SIDED COLON LESION  Plan: 1. EGD POSSIBLE ENTEROSCOPY/GIVENS. DISCUSSED PROCEDURE, BENEFITS, & RISKS: < 1% chance of medication reaction, bleeding, OR perforation. 2. PPI 3. SUPPORTIVE CARE   HPI:  BLACK TARRY STOOLS x5 STARTING AT 5 AM YESTERDAY. PAIN STARTED AROUND 6 AM. LOWER ABDOMEN AND SPREAD TO SIDES DOWN LOW. RARE ASA OR GOODY POWDERS. NO NEW DIETS/MEDS/EXERTION. NO IBUPROFEN/MOTRIN, OR NAPROXEN/ALEVE. NO ETOH. NO PRECIPITATING FACTORS. SHARP PAIN. MOSTLY BLACK BLOOD. THEN CAME TO ED.   PT DENIES FEVER, CHILLS, BLOOD IN URINE OR DYSURIA, nausea, vomiting, melena, diarrhea, CHEST PAIN, SHORTNESS OF BREATH,   constipation, abdominal pain,, problems with sedation, heartburn or indigestion. BMs: Q1-2 DAYS NL. NO LONGER TAKING STEROIDS.  Past Medical History  Diagnosis Date  . Varicose veins of both lower extremities     SUPERFICIAL CLOTS?  Marland Kitchen Hypertension    Past Surgical History  Procedure Laterality Date  . Hernia repair      Prior to Admission medications   Medication Sig Start Date End Date Taking? Authorizing Provider                                Current Facility-Administered Medications  Medication Dose Route Frequency Provider Last Rate Last Dose  . 0.9 %  sodium chloride infusion   Intravenous Continuous Phillips Grout, MD      . HYDROmorphone (DILAUDID) injection 1 mg  1 mg Intravenous Q4H PRN Phillips Grout, MD   1 mg at 04/16/14 0430  . ondansetron (ZOFRAN) tablet 4 mg  4 mg Oral Q6H PRN Phillips Grout, MD       Or  . ondansetron (ZOFRAN) injection 4 mg  4 mg Intravenous Q6H PRN Phillips Grout, MD      . pantoprazole (PROTONIX) 80 mg in sodium chloride 0.9 % 250 mL (0.32  mg/mL) infusion  8 mg/hr Intravenous Continuous Julianne Rice, MD 25 mL/hr at 04/16/14 0026 8 mg/hr at 04/16/14 0026    Allergies as of 04/15/2014  . (No Known Allergies)   Family History  Problem Relation Age of Onset  . Colon cancer      NEGATIVE  . Colon polyps      NEGATIVE  . Bleeding Disorder      NEGATIVE   History   Social History  . Marital Status: Divorced    Spouse Name: N/A    Number of Children: TWO  . Years of Education: N/A   Social History Main Topics  . Smoking status: Current Every Day Smoker -- 0.50 packs/day  . Smokeless tobacco: Not on file  . Alcohol Use: No  . Drug Use: No  . Sexual Activity: Not on file   Review of Systems: PER HPI OTHERWISE ALL SYSTEMS ARE NEGATIVE.  Vitals: Blood pressure 126/72, pulse 57, temperature 97.8 F (36.6 C), temperature source Oral, resp. rate 17, height 5' 7.5" (1.715 m), weight 195 lb (88.451 kg), SpO2 100.00%.  Physical Exam: General:   Alert,  Well-developed, well-nourished, pleasant and cooperative in NAD Head:  Normocephalic and atraumatic. Eyes:  Sclera clear, no icterus.   Conjunctiva pink. Mouth:  No lesions, dentition normal. Neck:  Supple; no masses. Lungs:  Clear throughout to auscultation.  No wheezes. No acute distress. Heart:  Regular rate and rhythm; no murmurs, clicks, rubs,  or gallops. Abdomen:  Soft, nontender and nondistended. No masses, hepatosplenomegaly or hernias noted. Normal bowel sounds, without guarding, and without rebound.   Msk:  Symmetrical without gross deformities. Normal posture. Extremities:  Without edema. Neurologic:  Alert and  oriented x4;  grossly normal neurologically. Cervical Nodes:  No significant cervical adenopathy. Psych:  Alert and cooperative. Normal mood and affect.   Lab Results:  Recent Labs  04/15/14 2322 04/16/14 0621  WBC 9.3 8.3  HGB 14.2 12.4*  HCT 40.8 35.9*  PLT 267 232   BMET  Recent Labs  04/15/14 2322 04/16/14 0621  NA 143 143   K 3.7 4.0  CL 104 109  CO2 27 25  GLUCOSE 76 78  BUN 36* 26*  CREATININE 0.76 0.83  CALCIUM 9.3 8.6   LFT  Recent Labs  04/15/14 2322  PROT 7.1  ALBUMIN 4.0  AST 13  ALT 9  ALKPHOS 51  BILITOT 0.5     Studies/Results: CT ABD/PELVIS AUG 22 NAIAP   LOS: 1 day   Junelle Hashemi  04/16/2014, 8:39 AM

## 2014-04-17 LAB — CBC
HCT: 37 % — ABNORMAL LOW (ref 39.0–52.0)
HEMOGLOBIN: 13 g/dL (ref 13.0–17.0)
MCH: 30.1 pg (ref 26.0–34.0)
MCHC: 35.1 g/dL (ref 30.0–36.0)
MCV: 85.6 fL (ref 78.0–100.0)
Platelets: 227 10*3/uL (ref 150–400)
RBC: 4.32 MIL/uL (ref 4.22–5.81)
RDW: 13 % (ref 11.5–15.5)
WBC: 6.2 10*3/uL (ref 4.0–10.5)

## 2014-04-17 LAB — OCCULT BLOOD, POC DEVICE: Fecal Occult Bld: POSITIVE — AB

## 2014-04-17 MED ORDER — SODIUM CHLORIDE 0.9 % IV SOLN
INTRAVENOUS | Status: DC
  Administered 2014-04-17 – 2014-04-18 (×2): via INTRAVENOUS

## 2014-04-17 MED ORDER — BISACODYL 5 MG PO TBEC
10.0000 mg | DELAYED_RELEASE_TABLET | ORAL | Status: AC
  Administered 2014-04-17 (×2): 10 mg via ORAL
  Filled 2014-04-17 (×2): qty 2

## 2014-04-17 MED ORDER — PEG 3350-KCL-NA BICARB-NACL 420 G PO SOLR
4000.0000 mL | Freq: Once | ORAL | Status: AC
  Administered 2014-04-17: 4000 mL via ORAL
  Filled 2014-04-17: qty 4000

## 2014-04-17 NOTE — Care Management Note (Signed)
    Page 1 of 1   04/17/2014     11:45:43 AM CARE MANAGEMENT NOTE 04/17/2014  Patient:  Rodney Valdez   Account Number:  [id number]  Date Initiated:  04/17/2014  Documentation initiated by:  CHILDRESS,JESSICA  Subjective/Objective Assessment:   Pt from home with parents. Pt independent with ADL's, no HH services, DME's or medication needs prior to administartion.     Action/Plan:   Pt plans to discahrge home with self care. Pt has no CM needs at this time.   Anticipated DC Date:  04/18/2014   Anticipated DC Plan:  [place]  CM consult      Choice offered to / List presented to:             Status of service:  Completed, signed off Medicare Important Message given?   (If response is "NO", the following Medicare IM given date fields will be blank) Date Medicare IM given:   Medicare IM given by:   Date Additional Medicare IM given:   Additional Medicare IM given by:    Discharge Disposition:  HOME/SELF CARE  Per UR Regulation:    If discussed at Long Length of Stay Meetings, dates discussed:    Comments:  04/17/2014 [place], RN, MSN, [facility]

## 2014-04-17 NOTE — Progress Notes (Signed)
Full note to follow regarding capsule study. Small bowel without any evidence of bleeding lesions; prep poor. Distal colon with evidence of overt bleeding, culprit lesion(s) unable to be distinctly identified. Will arrange colonoscopy for 8/25 with Dr. Oneida Alar, utilizing Propofol.  Orvil Feil, ANP-BC [facility]

## 2014-04-17 NOTE — Progress Notes (Signed)
Subjective: Lower abdominal discomfort, improved from admission but still present. No N/V. Melena overnight reportedly.   Objective: Vital signs in last 24 hours: Temp:  [98 F (36.7 C)-98.1 F (36.7 C)] 98.1 F (36.7 C) (08/24 0627) Pulse Rate:  [64-70] 70 (08/24 0627) Resp:  [15-17] 17 (08/24 0627) BP: (104-123)/(73-81) 120/76 mmHg (08/24 0627) SpO2:  [99 %-100 %] 100 % (08/24 0627) Last BM Date: 04/17/14 General:   Alert and oriented, pleasant Abdomen:  Bowel sounds present, soft, mild discomfort lower abdomen with palpation, non-distended. No HSM or hernias noted. No rebound or guarding. No masses appreciated  Msk:  Symmetrical without gross deformities. Normal posture. Extremities:  Without edema. Neurologic:  Alert and  oriented x4;  grossly normal neurologically. Skin:  Warm and dry, intact without significant lesions.  Psych:  Alert and cooperative. Normal mood and affect.  Intake/Output from previous day: 08/23 0701 - 08/24 0700 In: 1193.8 [I.V.:1193.8] Out: 400 [Urine:400] Intake/Output this shift:    Lab Results:  Recent Labs  04/16/14 0621 04/16/14 1301 04/17/14 0613  WBC 8.3 7.3 6.2  HGB 12.4* 13.0 13.0  HCT 35.9* 36.8* 37.0*  PLT 232 227 227   BMET  Recent Labs  04/15/14 2322 04/16/14 0621  NA 143 143  K 3.7 4.0  CL 104 109  CO2 27 25  GLUCOSE 76 78  BUN 36* 26*  CREATININE 0.76 0.83  CALCIUM 9.3 8.6   LFT  Recent Labs  04/15/14 2322  PROT 7.1  ALBUMIN 4.0  AST 13  ALT 9  ALKPHOS 51  BILITOT 0.5   PT/INR  Recent Labs  04/15/14 2322 04/16/14 0621  LABPROT 13.7 15.0  INR 1.05 1.18    Studies/Results: Ct Abdomen Pelvis W Contrast  04/16/2014   CLINICAL DATA:  Lower abdominal pain.  GI bleed  EXAM: CT ABDOMEN AND PELVIS WITH CONTRAST  TECHNIQUE: Multidetector CT imaging of the abdomen and pelvis was performed using the standard protocol following bolus administration of intravenous contrast.  CONTRAST:  33mL OMNIPAQUE  IOHEXOL 300 MG/ML SOLN, 155mL OMNIPAQUE IOHEXOL 300 MG/ML SOLN  COMPARISON:  None.  FINDINGS: BODY WALL: Fatty enlargement of the bilateral inguinal canals, suspect developing inguinal hernias.  LOWER CHEST: Unremarkable.  ABDOMEN/PELVIS:  Liver: There are few tiny low densities within the liver which are indeterminate but likely incidental.  Biliary: Cholelithiasis.  No gallbladder distention or inflammation.  Pancreas: Unremarkable.  Spleen: Unremarkable.  Adrenals: Unremarkable.  Kidneys and ureters: 4 mm stone in the upper pole right kidney. 3 mm calculus in the interpolar left kidney.  Bladder: Unremarkable.  Reproductive: Unremarkable.  Bowel: No obstruction. Distal colonic diverticulosis which is mild but notable did age.  Retroperitoneum: No mass or adenopathy.  Peritoneum: No ascites or pneumoperitoneum.  Vascular: No acute abnormality.  OSSEOUS: Remote L3 and L4 left transverse process fractures.  IMPRESSION: 1. No acute intra-abdominal findings. 2. Diverticulosis.  No other identifiable cause for GI bleeding. 3. Bilateral nonobstructive nephrolithiasis. 4. Cholelithiasis.   Electronically Signed   By: Jorje Guild M.D.   On: 04/16/2014 02:39    Assessment: 33 year old male admitted with melena and lower abdominal pain. EGD with moderate erosive gastritis and mild duodenitis, three small non-bleeding ulcers in antrum. Capsule with evidence of active bleeding in colon but unable to identify lesion. Will need colonoscopy tomorrow, 8/25 with Dr. Oneida Alar. Will utilize Propofol due to failed sedation with EGD.      Plan: Check H.pylori serology EGD in 3 months to verify ulcer healing  Colonoscopy on 8/25 with Propofol with Dr. Oneida Alar PPI BID Clear liquids Golytely this afternoon  Orvil Feil, ANP-BC [facility]    LOS: 2 days    04/17/2014, 12:23 PM

## 2014-04-17 NOTE — Progress Notes (Signed)
  PROGRESS NOTE  Rodney Valdez YJE:[id number] DOB: February 15, 1981 DOA: 04/15/2014 PCP: Glo Herring., MD  Summary: 33yom presented with abd pain, dark stools, admitted for GIB. CT ab/pelvis showed diverticulosis but otherwise unremarkable. Hemoglobin stabilized and has not required blood products. EGD was unrevealing a capsule study revealed bleeding in the colon of unclear etiology. Colonoscopy is planned for 8/25.  Assessment/Plan: 1. GIB, source appears to be colonic. EGD was unremarkable A capsule study showed bleeding in the colon. 2. ABLA. Hemoglobin is stabilized. No evidence of ongoing bleeding.   Plan for colonoscopy a/25.  Continue PPI  Code Status: full code DVT prophylaxis: SCDs Family Communication: discussed with father at bedside Disposition Plan: home when improved  Murray Hodgkins, MD  [facility]  Pager [phone] If 7PM-7AM, please contact night-coverage at [email or web address], password [facility] 04/17/2014, 2:19 PM  LOS: 2 days   Consultants:  GI  Procedures:  EGD ENDOSCOPIC IMPRESSION:  1. NO SOURCE FOR MELENA IDENTIFIED  2. MODERATE Erosive gastritis AND MILD DUODENITIS  3. Three small non-bleeding ulcers in the gastric antrum  4. PT SWALLOWED GIVENS CAPSULE  Capsule study Evidence of bleeding in proximal colon without identifiable culprit. Needs colonoscopy for further evaluation.   Antibiotics:    HPI/Subjective: He is feeling better but still still has lower abdominal discomfort, predominantly left-sided.  Objective: Filed Vitals:   04/16/14 1559 04/16/14 2304 04/17/14 0627 04/17/14 1415  BP: 123/81 104/73 120/76 138/88  Pulse: 69 64 70 60  Temp: 98 F (36.7 C) 98.1 F (36.7 C) 98.1 F (36.7 C) 98 F (36.7 C)  TempSrc: Oral Oral Oral   Resp: 16 15 17 16   Height:      Weight:      SpO2: 100% 99% 100% 100%    Intake/Output Summary (Last 24 hours) at 04/17/14 1419 Last data filed at 04/17/14 0800  Gross per 24 hour  Intake 1193.75 ml   Output    400 ml  Net 793.75 ml     Filed Weights   04/15/14 2143  Weight: 88.451 kg (195 lb)    Exam:     Afebrile, vitals stable. No hypoxia.  Respiratory clear to auscultation. No wheezes, rales or rhonchi. Normal respiratory.  Cardiovascular regular rate and rhythm no murmur, rub or gallop.  Abdomen soft  Psychiatric grossly normal mood and affect. Speech fluent and appropriate.  Data Reviewed:  Hemoglobin remains stable.  Scheduled Meds: . pantoprazole  40 mg Oral BID AC   Continuous Infusions: . sodium chloride 20 mL/hr at 04/17/14 1312    Principal Problem:   GI bleed Active Problems:   Melena   Abdominal pain, other specified site   Hypertension   Acute blood loss anemia   Time spent 15 minutes

## 2014-04-17 NOTE — Progress Notes (Signed)
UR review complete.  

## 2014-04-17 NOTE — Procedures (Signed)
Small Bowel Givens Capsule Study Procedure date:  04/16/14  Referring Provider:  Dr. Oneida Alar PCP:  Dr. Glo Herring., MD  Indication for procedure:   33 year old male admitted with melena and lower abdominal pain. EGD with moderate erosive gastritis and mild duodenitis, three small non-bleeding ulcers in antrum. No convincing evidence to point towards etiology for melena. Capsule study now for further investigation of small bowel.   Findings:   Capsule study complete to the cecum. Poor visualization of small bowel due to debris, PROBABLE MELENA stained mucosa(3 hr 24 mins). No overt lesions, evidence of bleeding. As capsule transitioned to cecum, notable evidence of MELENA without actual identifiable lesion. POSSIBLE BRIGHT RED BLOOD noted at 6:04:25. NO obvious bleeding located in proximal colon.   First Gastric image:  23:45 First Duodenal image: 2:06:59 First Cecal image: 5:59:13 Gastric Passage time: 1h 1m Small Bowel Passage time:  3h 46m  Summary & Recommendations: Possible bleeding in proximal colon without identifiable culprit. Needs colonoscopy/possible ENTEROSCOPY for further evaluation. Proceed on 8/25with Dr. Oneida Alar WITH PROPOFOL DUE TO PT FAILED CONSCIOUS SEDATION ON AUG 23. The risks, benefits, and alternatives have been discussed in detail with the patient. They state understanding and desire to proceed.   Orvil Feil, ANP-BC [facility]

## 2014-04-18 ENCOUNTER — Encounter (HOSPITAL_COMMUNITY): Payer: MEDICAID | Admitting: Anesthesiology

## 2014-04-18 ENCOUNTER — Encounter (HOSPITAL_COMMUNITY)
Admission: EM | Disposition: A | Discharge status: Home / Self Care | Payer: Self-pay | Source: Home / Self Care | Attending: Family Medicine

## 2014-04-18 ENCOUNTER — Inpatient Hospital Stay (HOSPITAL_COMMUNITY): Payer: MEDICAID

## 2014-04-18 ENCOUNTER — Inpatient Hospital Stay (HOSPITAL_COMMUNITY): Payer: Self-pay | Admitting: Anesthesiology

## 2014-04-18 ENCOUNTER — Encounter (HOSPITAL_COMMUNITY): Payer: Self-pay | Admitting: Gastroenterology

## 2014-04-18 DIAGNOSIS — K2970 Gastritis, unspecified, without bleeding: Secondary | ICD-10-CM | POA: Diagnosis present

## 2014-04-18 DIAGNOSIS — K2990 Gastroduodenitis, unspecified, without bleeding: Secondary | ICD-10-CM

## 2014-04-18 HISTORY — PX: COLONOSCOPY WITH PROPOFOL: SHX5780

## 2014-04-18 HISTORY — PX: POLYPECTOMY: SHX5525

## 2014-04-18 HISTORY — PX: ENTEROSCOPY: SHX5533

## 2014-04-18 LAB — H. PYLORI ANTIBODY, IGG

## 2014-04-18 SURGERY — COLONOSCOPY WITH PROPOFOL
Anesthesia: Monitor Anesthesia Care

## 2014-04-18 MED ORDER — PROPOFOL 10 MG/ML IV BOLUS
INTRAVENOUS | Status: AC
  Filled 2014-04-18: qty 20

## 2014-04-18 MED ORDER — SODIUM PERTECHNETATE TC 99M INJECTION
10.0000 | Freq: Once | INTRAVENOUS | Status: AC | PRN
  Administered 2014-04-18: 10 via INTRAVENOUS

## 2014-04-18 MED ORDER — PROPOFOL 10 MG/ML IV BOLUS
INTRAVENOUS | Status: DC | PRN
  Administered 2014-04-18 (×3): 20 mg via INTRAVENOUS

## 2014-04-18 MED ORDER — ONDANSETRON HCL 4 MG/2ML IJ SOLN
4.0000 mg | Freq: Once | INTRAMUSCULAR | Status: AC
  Administered 2014-04-18: 4 mg via INTRAVENOUS

## 2014-04-18 MED ORDER — MIDAZOLAM HCL 5 MG/5ML IJ SOLN
INTRAMUSCULAR | Status: DC | PRN
  Administered 2014-04-18: 2 mg via INTRAVENOUS

## 2014-04-18 MED ORDER — DEXAMETHASONE SODIUM PHOSPHATE 4 MG/ML IJ SOLN
INTRAMUSCULAR | Status: AC
  Filled 2014-04-18: qty 1

## 2014-04-18 MED ORDER — STERILE WATER FOR IRRIGATION IR SOLN
Status: DC | PRN
  Administered 2014-04-18: 13:00:00

## 2014-04-18 MED ORDER — LACTATED RINGERS IV SOLN
INTRAVENOUS | Status: DC
  Administered 2014-04-18 (×2): via INTRAVENOUS

## 2014-04-18 MED ORDER — FENTANYL CITRATE 0.05 MG/ML IJ SOLN
25.0000 ug | INTRAMUSCULAR | Status: AC
  Administered 2014-04-18 (×2): 25 ug via INTRAVENOUS

## 2014-04-18 MED ORDER — GLYCOPYRROLATE 0.2 MG/ML IJ SOLN
INTRAMUSCULAR | Status: AC
  Filled 2014-04-18: qty 1

## 2014-04-18 MED ORDER — FENTANYL CITRATE 0.05 MG/ML IJ SOLN: 25.0000 ug | INTRAMUSCULAR | Status: DC | PRN

## 2014-04-18 MED ORDER — PROPOFOL 10 MG/ML IV EMUL
INTRAVENOUS | Status: AC
  Filled 2014-04-18: qty 20

## 2014-04-18 MED ORDER — GLYCOPYRROLATE 0.2 MG/ML IJ SOLN
0.2000 mg | Freq: Once | INTRAMUSCULAR | Status: AC
  Administered 2014-04-18: 0.2 mg via INTRAVENOUS

## 2014-04-18 MED ORDER — PROPOFOL INFUSION 10 MG/ML OPTIME
INTRAVENOUS | Status: DC | PRN
  Administered 2014-04-18: 13:00:00 via INTRAVENOUS
  Administered 2014-04-18: 150 ug/kg/min via INTRAVENOUS
  Administered 2014-04-18: 125 ug/kg/min via INTRAVENOUS
  Administered 2014-04-18: 150 ug/kg/min via INTRAVENOUS

## 2014-04-18 MED ORDER — FENTANYL CITRATE 0.05 MG/ML IJ SOLN
INTRAMUSCULAR | Status: AC
  Filled 2014-04-18: qty 2

## 2014-04-18 MED ORDER — FENTANYL CITRATE 0.05 MG/ML IJ SOLN
INTRAMUSCULAR | Status: DC | PRN
  Administered 2014-04-18 (×3): 25 ug via INTRAVENOUS

## 2014-04-18 MED ORDER — GLUCAGON HCL RDNA (DIAGNOSTIC) 1 MG IJ SOLR
INTRAMUSCULAR | Status: AC
  Filled 2014-04-18: qty 1

## 2014-04-18 MED ORDER — ONDANSETRON HCL 4 MG/2ML IJ SOLN
INTRAMUSCULAR | Status: AC
  Filled 2014-04-18: qty 2

## 2014-04-18 MED ORDER — STERILE WATER FOR INJECTION IJ SOLN
INTRAMUSCULAR | Status: AC
  Filled 2014-04-18: qty 10

## 2014-04-18 MED ORDER — MIDAZOLAM HCL 2 MG/2ML IJ SOLN
INTRAMUSCULAR | Status: AC
  Filled 2014-04-18: qty 2

## 2014-04-18 MED ORDER — PANTOPRAZOLE SODIUM 40 MG PO TBEC: 40.0000 mg | DELAYED_RELEASE_TABLET | Freq: Two times a day (BID) | ORAL | Status: DC

## 2014-04-18 MED ORDER — MIDAZOLAM HCL 2 MG/2ML IJ SOLN
1.0000 mg | INTRAMUSCULAR | Status: DC | PRN
  Administered 2014-04-18: 2 mg via INTRAVENOUS

## 2014-04-18 MED ORDER — LIDOCAINE VISCOUS 2 % MT SOLN
OROMUCOSAL | Status: AC
  Administered 2014-04-18: 15 mL via OROMUCOSAL
  Filled 2014-04-18: qty 15

## 2014-04-18 MED ORDER — ONDANSETRON HCL 4 MG/2ML IJ SOLN: 4.0000 mg | Freq: Once | INTRAMUSCULAR | Status: DC | PRN

## 2014-04-18 MED ORDER — WATER FOR IRRIGATION, STERILE IR SOLN
Status: DC | PRN
  Administered 2014-04-18: 1000 mL

## 2014-04-18 MED ORDER — GLUCAGON HCL RDNA (DIAGNOSTIC) 1 MG IJ SOLR
INTRAMUSCULAR | Status: DC | PRN
  Administered 2014-04-18: .05 mg via INTRAVENOUS

## 2014-04-18 SURGICAL SUPPLY — 24 items
ELECT REM PT RETURN 9FT ADLT (ELECTROSURGICAL) ×3
ELECTRODE REM PT RTRN 9FT ADLT (ELECTROSURGICAL) IMPLANT
FCP BXJMBJMB 240X2.8X (CUTTING FORCEPS)
FLOOR PAD 36X40 (MISCELLANEOUS) ×3
FORCEPS BIOP RAD 4 LRG CAP 4 (CUTTING FORCEPS) ×2 IMPLANT
FORCEPS BIOP RJ4 240 W/NDL (CUTTING FORCEPS)
FORCEPS BXJMBJMB 240X2.8X (CUTTING FORCEPS) IMPLANT
FORMALIN 10 PREFIL 20ML (MISCELLANEOUS) ×6 IMPLANT
INJECTOR/SNARE I SNARE (MISCELLANEOUS) IMPLANT
KIT CLEAN ENDO COMPLIANCE (KITS) ×3 IMPLANT
LUBRICANT JELLY 4.5OZ STERILE (MISCELLANEOUS) ×2 IMPLANT
MANIFOLD NEPTUNE II (INSTRUMENTS) ×2 IMPLANT
NDL SCLEROTHERAPY 25GX240 (NEEDLE) IMPLANT
NEEDLE SCLEROTHERAPY 25GX240 (NEEDLE) IMPLANT
PAD FLOOR 36X40 (MISCELLANEOUS) ×1 IMPLANT
PROBE APC STR FIRE (PROBE) IMPLANT
PROBE INJECTION GOLD (MISCELLANEOUS)
PROBE INJECTION GOLD 7FR (MISCELLANEOUS) IMPLANT
SNARE ROTATE MED OVAL 20MM (MISCELLANEOUS) ×2 IMPLANT
SNARE SHORT THROW 13M SML OVAL (MISCELLANEOUS) ×1 IMPLANT
SYR 50ML LL SCALE MARK (SYRINGE) ×2 IMPLANT
TRAP SPECIMEN MUCOUS 40CC (MISCELLANEOUS) ×2 IMPLANT
TUBING IRRIGATION ENDOGATOR (MISCELLANEOUS) ×2 IMPLANT
WATER STERILE IRR 1000ML POUR (IV SOLUTION) ×2 IMPLANT

## 2014-04-18 NOTE — Discharge Summary (Signed)
Physician Discharge Summary  CORDAI RODRIGUE ZOX:[id number] DOB: [birth year] DOA: 04/15/2014  PCP: Glo Herring., MD  Admit date: 04/15/2014 Discharge date: 04/18/2014  Time spent: 30 minutes  Recommendations for Outpatient Follow-up:  1. Followup with PCP in one week and have hemoglobin rechecked. 2. Followup with GI in 4 weeks  Discharge Diagnoses:  Principal Problem:   GI bleed  Active Problems:   Melena   Abdominal pain, other specified site   Acute blood loss anemia   Unspecified gastritis and gastroduodenitis without mention of hemorrhage   Discharge Condition: Fair  Diet recommendation: Regular  Filed Weights   04/15/14 2143  Weight: 88.451 kg (195 lb)    History of present illness:  Please refer to admission H&P for details but in brief 33 year old male presented to the ED with a 2 day duration of abdominal pain and bilateral lower quadrant with malena. Patient admitted for GI bleed. CT scan of the abdomen and pelvis showed diverticulosis.   Hospital Course:  Acute GI bleed Patient did not require any transfusion as hemoglobin remained stable. EGD showed gastritis and duodenitis without any bleeding. Capsule study was done showing bleeding in the colon without a definite source. 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 3 polyps were removed.  A Meckel scan was done after the colonoscopy which was unremarkable as well. Patient has remained hemodynamically stable and hemoglobin is stable at 13.Marland Kitchen He has been placed on a twice a day PPI and will be discharged on this until he follows up with GI within 4 weeks. He will followup with his PCP in one week and have hemoglobin rechecked.   Patient clinically stable to be discharged home.   Procedures:  EGD/colonoscopy  Capsule study    Consultations:  GI (Dr. Oneida Alar)  Discharge Exam: Filed Vitals:   04/18/14 1432  BP: 113/61  Pulse: 65  Temp:    Resp: 18    General: Middle aged male in no acute distress HEENT: No pallor, moist oral mucosa Chest: Clear to auscultation bilaterally CVS: Normal S1 and S2 Abdomen: Soft, nontender, nondistended, bowel sounds present Extremities: Warm, no edema   Discharge Instructions You were cared for by a hospitalist during your hospital stay. If you have any questions about your discharge medications or the care you received while you were in the hospital after you are discharged, you can call the unit and asked to speak with the hospitalist on call if the hospitalist that took care of you is not available. Once you are discharged, your primary care physician will handle any further medical issues. Please note that NO REFILLS for any discharge medications will be authorized once you are discharged, as it is imperative that you return to your primary care physician (or establish a relationship with a primary care physician if you do not have one) for your aftercare needs so that they can reassess your need for medications and monitor your lab values.     Medication List         HYDROcodone-acetaminophen 5-325 MG per tablet  Commonly known as:  NORCO/VICODIN  Take 1 tablet by mouth every 4 (four) hours as needed for moderate pain.     methocarbamol 500 MG tablet  Commonly known as:  ROBAXIN  Take 1 tablet (500 mg total) by mouth 3 (three) times daily.     pantoprazole 40 MG tablet  Commonly known as:  PROTONIX  Take 1 tablet (40 mg total) by  mouth 2 (two) times daily before a meal.       No Known Allergies     Follow-up Information   Follow up with Glo Herring., MD. Schedule an appointment as soon as possible for a visit in 1 week.   Specialty:  Internal Medicine   Contact information:   [address] [place] [address] [phone]       Follow up with Barney Drain, MD. Schedule an appointment as soon as possible for a visit in 4 weeks.   Specialty:   Gastroenterology   Contact information:   [place] [address] [place] [address] [phone]        The results of significant diagnostics from this hospitalization (including imaging, microbiology, ancillary and laboratory) are listed below for reference.    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  RADIOPHARMACEUTICALS:  10 mCi Tc-53m pertechnetate IV  COMPARISON:  None  FINDINGS: Uptake of tracer by gastric mucosa.  Excreted tracer within urinary tract and bladder.  Blood pool visualization of tracer noted.  No definite abnormal intra-abdominal localization of tracer identified to suggest ectopic gastric mucosa and a Meckel diverticulum.  IMPRESSION: Negative Meckel scan.   Electronically Signed   By: Lavonia Dana M.D.   On: 04/18/2014 16:56   Ct Abdomen Pelvis W Contrast  04/16/2014   CLINICAL DATA:  Lower abdominal pain.  GI bleed  EXAM: CT ABDOMEN AND PELVIS WITH CONTRAST  TECHNIQUE: Multidetector CT imaging of the abdomen and pelvis was performed using the standard protocol following bolus administration of intravenous contrast.  CONTRAST:  50mL OMNIPAQUE IOHEXOL 300 MG/ML SOLN, 194mL OMNIPAQUE IOHEXOL 300 MG/ML SOLN  COMPARISON:  None.  FINDINGS: BODY WALL: Fatty enlargement of the bilateral inguinal canals, suspect developing inguinal hernias.  LOWER CHEST: Unremarkable.  ABDOMEN/PELVIS:  Liver: There are few tiny low densities within the liver which are indeterminate but likely incidental.  Biliary: Cholelithiasis.  No gallbladder distention or inflammation.  Pancreas: Unremarkable.  Spleen: Unremarkable.  Adrenals: Unremarkable.  Kidneys and ureters: 4 mm stone in the upper pole right kidney. 3 mm  calculus in the interpolar left kidney.  Bladder: Unremarkable.  Reproductive: Unremarkable.  Bowel: No obstruction. Distal colonic diverticulosis which is mild but notable did age.  Retroperitoneum: No mass or adenopathy.  Peritoneum: No ascites or pneumoperitoneum.  Vascular: No acute abnormality.  OSSEOUS: Remote L3 and L4 left transverse process fractures.  IMPRESSION: 1. No acute intra-abdominal findings. 2. Diverticulosis.  No other identifiable cause for GI bleeding. 3. Bilateral nonobstructive nephrolithiasis. 4. Cholelithiasis.   Electronically Signed   By: Jorje Guild M.D.   On: 04/16/2014 02:39    Microbiology: No results found for this or any previous visit (from the past 240 hour(s)).   Labs: Basic Metabolic Panel:  Recent Labs Lab 04/15/14 2322 04/16/14 0621  NA 143 143  K 3.7 4.0  CL 104 109  CO2 27 25  GLUCOSE 76 78  BUN 36* 26*  CREATININE 0.76 0.83  CALCIUM 9.3 8.6   Liver Function Tests:  Recent Labs Lab 04/15/14 2322  AST 13  ALT 9  ALKPHOS 51  BILITOT 0.5  PROT 7.1  ALBUMIN 4.0    Recent Labs Lab 04/15/14 2322  LIPASE 21   No results found for this  basename: AMMONIA,  in the last 168 hours CBC:  Recent Labs Lab 04/15/14 2322 04/16/14 0621 04/16/14 1301 04/17/14 0613  WBC 9.3 8.3 7.3 6.2  HGB 14.2 12.4* 13.0 13.0  HCT 40.8 35.9* 36.8* 37.0*  MCV 86.4 86.5 86.2 85.6  PLT 267 232 227 227   Cardiac Enzymes: No results found for this basename: CKTOTAL, CKMB, CKMBINDEX, TROPONINI,  in the last 168 hours BNP: BNP (last 3 results) No results found for this basename: PROBNP,  in the last 8760 hours CBG: No results found for this basename: GLUCAP,  in the last 168 hours     Signed:  Louellen Molder  [facility] 04/18/2014, 5:18 PM

## 2014-04-18 NOTE — H&P (View-Only) (Signed)
Referring Provider: No ref. provider found Primary Care Physician:  Glo Herring., MD Primary Gastroenterologist:  Barney Drain  Reason for Consultation:  MELENA   Impression: ADMITTED WITH MELENA ETIOLOGY UNCLEAR: PUD, DIEULAFOY'S LESION, LESS LIKELY MECKEL'S DIVERTICULUM OR R SIDED COLON LESION  Plan: 1. EGD POSSIBLE ENTEROSCOPY/GIVENS. DISCUSSED PROCEDURE, BENEFITS, & RISKS: < 1% chance of medication reaction, bleeding, OR perforation. 2. PPI 3. SUPPORTIVE CARE   HPI:  BLACK TARRY STOOLS x5 STARTING AT 5 AM YESTERDAY. PAIN STARTED AROUND 6 AM. LOWER ABDOMEN AND SPREAD TO SIDES DOWN LOW. RARE ASA OR GOODY POWDERS. NO NEW DIETS/MEDS/EXERTION. NO IBUPROFEN/MOTRIN, OR NAPROXEN/ALEVE. NO ETOH. NO PRECIPITATING FACTORS. SHARP PAIN. MOSTLY BLACK BLOOD. THEN CAME TO ED.   PT DENIES FEVER, CHILLS, BLOOD IN URINE OR DYSURIA, nausea, vomiting, melena, diarrhea, CHEST PAIN, SHORTNESS OF BREATH,   constipation, abdominal pain,, problems with sedation, heartburn or indigestion. BMs: Q1-2 DAYS NL. NO LONGER TAKING STEROIDS.  Past Medical History  Diagnosis Date  . Varicose veins of both lower extremities     SUPERFICIAL CLOTS?  Marland Kitchen Hypertension    Past Surgical History  Procedure Laterality Date  . Hernia repair      Prior to Admission medications   Medication Sig Start Date End Date Taking? Authorizing Provider                                Current Facility-Administered Medications  Medication Dose Route Frequency Provider Last Rate Last Dose  . 0.9 %  sodium chloride infusion   Intravenous Continuous Phillips Grout, MD      . HYDROmorphone (DILAUDID) injection 1 mg  1 mg Intravenous Q4H PRN Phillips Grout, MD   1 mg at 04/16/14 0430  . ondansetron (ZOFRAN) tablet 4 mg  4 mg Oral Q6H PRN Phillips Grout, MD       Or  . ondansetron (ZOFRAN) injection 4 mg  4 mg Intravenous Q6H PRN Phillips Grout, MD      . pantoprazole (PROTONIX) 80 mg in sodium chloride 0.9 % 250 mL (0.32  mg/mL) infusion  8 mg/hr Intravenous Continuous Julianne Rice, MD 25 mL/hr at 04/16/14 0026 8 mg/hr at 04/16/14 0026    Allergies as of 04/15/2014  . (No Known Allergies)   Family History  Problem Relation Age of Onset  . Colon cancer      NEGATIVE  . Colon polyps      NEGATIVE  . Bleeding Disorder      NEGATIVE   History   Social History  . Marital Status: Divorced    Spouse Name: N/A    Number of Children: TWO  . Years of Education: N/A   Social History Main Topics  . Smoking status: Current Every Day Smoker -- 0.50 packs/day  . Smokeless tobacco: Not on file  . Alcohol Use: No  . Drug Use: No  . Sexual Activity: Not on file   Review of Systems: PER HPI OTHERWISE ALL SYSTEMS ARE NEGATIVE.  Vitals: Blood pressure 126/72, pulse 57, temperature 97.8 F (36.6 C), temperature source Oral, resp. rate 17, height 5' 7.5" (1.715 m), weight 195 lb (88.451 kg), SpO2 100.00%.  Physical Exam: General:   Alert,  Well-developed, well-nourished, pleasant and cooperative in NAD Head:  Normocephalic and atraumatic. Eyes:  Sclera clear, no icterus.   Conjunctiva pink. Mouth:  No lesions, dentition normal. Neck:  Supple; no masses. Lungs:  Clear throughout to auscultation.  No wheezes. No acute distress. Heart:  Regular rate and rhythm; no murmurs, clicks, rubs,  or gallops. Abdomen:  Soft, nontender and nondistended. No masses, hepatosplenomegaly or hernias noted. Normal bowel sounds, without guarding, and without rebound.   Msk:  Symmetrical without gross deformities. Normal posture. Extremities:  Without edema. Neurologic:  Alert and  oriented x4;  grossly normal neurologically. Cervical Nodes:  No significant cervical adenopathy. Psych:  Alert and cooperative. Normal mood and affect.   Lab Results:  Recent Labs  04/15/14 2322 04/16/14 0621  WBC 9.3 8.3  HGB 14.2 12.4*  HCT 40.8 35.9*  PLT 267 232   BMET  Recent Labs  04/15/14 2322 04/16/14 0621  NA 143 143   K 3.7 4.0  CL 104 109  CO2 27 25  GLUCOSE 76 78  BUN 36* 26*  CREATININE 0.76 0.83  CALCIUM 9.3 8.6   LFT  Recent Labs  04/15/14 2322  PROT 7.1  ALBUMIN 4.0  AST 13  ALT 9  ALKPHOS 51  BILITOT 0.5     Studies/Results: CT ABD/PELVIS AUG 22 NAIAP   LOS: 1 day   Metzli Pollick  04/16/2014, 8:39 AM

## 2014-04-18 NOTE — Discharge Instructions (Signed)
Gastrointestinal Bleeding °Gastrointestinal bleeding is bleeding somewhere along the path that food travels through the body (digestive tract). This path is anywhere between the mouth and the opening of the butt (anus). You may have blood in your throw up (vomit) or in your poop (stools). If there is a lot of bleeding, you may need to stay in the hospital. °HOME CARE °· Only take medicine as told by your doctor. °· Eat foods with fiber such as whole grains, fruits, and vegetables. You can also try eating 1 to 3 prunes a day. °· Drink enough fluids to keep your pee (urine) clear or pale yellow. °GET HELP RIGHT AWAY IF:  °· Your bleeding gets worse. °· You feel dizzy, weak, or you pass out (faint). °· You have bad cramps in your back or belly (abdomen). °· You have large blood clumps (clots) in your poop. °· Your problems are getting worse. °MAKE SURE YOU:  °· Understand these instructions. °· Will watch your condition. °· Will get help right away if you are not doing well or get worse. °Document Released: 05/20/2008 Document Revised: 07/28/2012 Document Reviewed: 07/21/2011 °ExitCare® Patient Information ©2015 [employer]. This information is not intended to replace advice given to you by your health care provider. Make sure you discuss any questions you have with your health care provider. ° ° °

## 2014-04-18 NOTE — Anesthesia Preprocedure Evaluation (Signed)
Anesthesia Evaluation  Patient identified by MRN, date of birth, ID band Patient awake    Reviewed: Allergy & Precautions, H&P , NPO status , Patient's Chart, lab work & pertinent test results  Airway Mallampati: II TM Distance: >3 FB     Dental  (+) Teeth Intact   Pulmonary Current Smoker,  breath sounds clear to auscultation        Cardiovascular hypertension, Pt. on medications + Peripheral Vascular Disease Rhythm:Regular Rate:Normal     Neuro/Psych    GI/Hepatic GI bleed   Endo/Other    Renal/GU      Musculoskeletal   Abdominal   Peds  Hematology   Anesthesia Other Findings   Reproductive/Obstetrics                           Anesthesia Physical Anesthesia Plan  ASA: II  Anesthesia Plan: MAC   Post-op Pain Management:    Induction: Intravenous  Airway Management Planned: Simple Face Mask  Additional Equipment:   Intra-op Plan:   Post-operative Plan:   Informed Consent: I have reviewed the patients History and Physical, chart, labs and discussed the procedure including the risks, benefits and alternatives for the proposed anesthesia with the patient or authorized representative who has indicated his/her understanding and acceptance.     Plan Discussed with:   Anesthesia Plan Comments:         Anesthesia Quick Evaluation

## 2014-04-18 NOTE — Plan of Care (Signed)
Tap water enema given as ordered.  Thus far, BM is mainly liquid.

## 2014-04-18 NOTE — Op Note (Signed)
[place], [address]   OPERATIVE PROCEDURE REPORT  PATIENT: Rodney Valdez, Rodney Valdez  MR#: [id number] BIRTHDATE: [birth year] , 33  yrs. old GENDER: Male ENDOSCOPIST: Barney Drain, MD REFERRED BY:  Kerin Perna, M.D. PROCEDURE DATE: 04/18/2014 PROCEDURE:   Small bowel enteroscopy with biopsy ASA CLASS: INDICATIONS:1.  melenic bleeding. GIVENS CAPSULE SHOWS BLOOD IN SMALL BOWEL AND COLON. MEDICATIONS: MAC sedation, administered by CRNA TOPICAL ANESTHETIC:   Viscous Xylocaine  DESCRIPTION OF PROCEDURE:   After the risks benefits and alternatives of the procedure were thoroughly explained, informed consent was obtained.  The     endoscope was introduced through the mouth  and advanced to the proximal jejunum jejunum , limited by Without limitations.   The instrument was slowly withdrawn as the mucosa was fully examined.    A Schatzki's ring was found A hiatal hernia was found Multiple ulcers were found in the antrum.  Multiple biopsies were obtained and sent to pathology.   Gastritis was found in the antrum. Multiple biopsies were obtained and sent to pathology.   Duodenitis was found in the bulb of the duodenum.   The remainder of the small bowel enteroscopy exam was otherwise normal TO THE PROXIMAL JEJEUNUM.Marland Kitchen   Retroflexed views revealed no abnormalities.    The scope was then withdrawn from the patient and the procedure terminated.  COMPLICATIONS: There were no complications. ENDOSCOPIC IMPRESSION: 1.   Schatzki's ring was found 2.   Hiatal hernia was found 3.   Multiple SMALL SUPERFICIAL ulcers  in the antrum 4.   MILD Gastritis in the antrum 5.   MODERATE Duodenitis In the bulb of the duodenum 6.   NO SOURCE FOR MELENA IDENTIFIED  RECOMMENDATIONS: AWAIT BIOPSY BID PPI PT NEEDS MECKEL'S SCAN AS AN OUTPATIENT ADVANCE DIET  REPEAT EXAM:  _______________________________ Lorrin MaisBarney Drain, MD 04/18/2014 5:13 PM   CC:

## 2014-04-18 NOTE — Interval H&P Note (Signed)
History and Physical Interval Note:  04/18/2014 11:24 AM  Rodney Valdez  has presented today for surgery, with the diagnosis of active bleeding on capsule, melena  The various methods of treatment have been discussed with the patient and family. After consideration of risks, benefits and other options for treatment, the patient has consented to  Procedure(s): COLONOSCOPY WITH PROPOFOL (N/A) POSSIBLE ENTEROSCOPY (N/A) as a surgical intervention .  The patient's history has been reviewed, patient examined, no change in status, stable for surgery.  I have reviewed the patient's chart and labs.  Questions were answered to the patient's satisfaction.     [employer]

## 2014-04-18 NOTE — Op Note (Signed)
[facility] [address] [place], [address]   COLONOSCOPY PROCEDURE REPORT  PATIENT: Rodney Valdez, Rodney Valdez  MR#: [id number] BIRTHDATE: November 18, 1980 , 33  yrs. old GENDER: Male ENDOSCOPIST: Barney Drain, MD REFERRED AX:[id number] Gerarda Fraction, M.D. PROCEDURE DATE:  04/18/2014 PROCEDURE:   Colonoscopy with snare polypectomy INDICATIONS:melena. MEDICATIONS: MAC sedation, administered by CRNA  DESCRIPTION OF PROCEDURE:    Physical exam was performed.  Informed consent was obtained from the patient after explaining the benefits, risks, and alternatives to procedure.  The patient was connected to monitor and placed in left lateral position. Continuous oxygen was provided by nasal cannula and IV medicine administered through an indwelling cannula.  After administration of sedation and rectal exam, the patients rectum was intubated and the     colonoscope was advanced under direct visualization to the ileum.  The scope was removed slowly by carefully examining the color, texture, anatomy, and integrity mucosa on the way out.  The patient was recovered in endoscopy and discharged home in satisfactory condition.    COLON FINDINGS: Small amount of melena in ileum.  NO ACTIVE BLEEDING, Three sessile polyps measuring 6-8 mm in size were found in the descending colon and ascending colon.  A polypectomy was performed using snare cautery.  , There was moderate diverticulosis noted in the descending colon and sigmoid colon with associated muscular hypertrophy and tortuosity.  , and The mucosa appeared normal in the terminal ileum.  PREP QUALITY: good.  CECAL W/D TIME: 17 minutes     COMPLICATIONS: None  ENDOSCOPIC IMPRESSION: 1.   Small amount of melena in ileum.  NO ACTIVE BLEEDING. 2.   Three COLON POLYPS REMOVED 3.   Moderate diverticulosis in the descending colon and sigmoid colon 4.   Normal mucosa in the terminal ileum  RECOMMENDATIONS: PROCEED TO ENTEROSCOPY AWAIT BIOPSY NEXT  TCS IN 3-5 YEARS       _______________________________ Lorrin MaisBarney Drain, MD 04/18/2014 5:18 PM

## 2014-04-18 NOTE — Anesthesia Postprocedure Evaluation (Signed)
  Anesthesia Post-op Note  Patient: Rodney Valdez  Procedure(s) Performed: Procedure(s) with comments: COLONOSCOPY WITH PROPOFOL (N/A) - Cecum time in 1320   time out 1338 ,  total time 18 minutes  ENTEROSCOPY WITH BIOPSYS (N/A) POLYPECTOMY (N/A)  Patient Location: PACU  Anesthesia Type:General  Level of Consciousness: awake, alert  and oriented  Airway and Oxygen Therapy: Patient Spontanous Breathing and Patient connected to face mask oxygen  Post-op Pain: none  Post-op Assessment: Post-op Vital signs reviewed, Patient's Cardiovascular Status Stable, Respiratory Function Stable, Patent Airway and No signs of Nausea or vomiting  Post-op Vital Signs: Reviewed and stable  Last Vitals:  Filed Vitals:   04/18/14 1255  BP: 123/81  Pulse:   Temp:   Resp: 16    Complications: No apparent anesthesia complications

## 2014-04-18 NOTE — Transfer of Care (Signed)
Immediate Anesthesia Transfer of Care Note  Patient: Rodney Valdez  Procedure(s) Performed: Procedure(s) with comments: COLONOSCOPY WITH PROPOFOL (N/A) - Cecum time in 1320   time out 1338 ,  total time 18 minutes  ENTEROSCOPY WITH BIOPSYS (N/A) POLYPECTOMY (N/A)  Patient Location: PACU  Anesthesia Type:MAC  Level of Consciousness: awake  Airway & Oxygen Therapy: Patient Spontanous Breathing  Post-op Assessment: Report given to PACU RN  Post vital signs: Reviewed and stable  Complications: No apparent anesthesia complications

## 2014-04-19 ENCOUNTER — Encounter (HOSPITAL_COMMUNITY): Payer: Self-pay | Admitting: Gastroenterology

## 2014-04-26 ENCOUNTER — Telehealth: Payer: Self-pay

## 2014-04-26 NOTE — Telephone Encounter (Signed)
PLEASE CALL PT. IF HE IS BLEEDING AND HAVING 7-8/10 ABDOMINAL PAIN HE SHOULD RETURN TO THE ED. HIS MECKEL'S SCAN IS NEGATIVE. HE HAD 2 SIMPLE ADENOMAS REMOVED FROM HIS COLON. HIS stomach Bx shows gastritis.

## 2014-04-26 NOTE — Telephone Encounter (Signed)
Pt had called the switch board at the hospital trying to get in touch with Dr. Oneida Alar. Judson Roch, from the switch board) called and LMOVM for me to call her. I called and she said this pt was trying to get in touch with Dr. Oneida Alar and she got him on the line for me. ( Pt is at home).   I spoke to pt. He said he tried to call her and could not get anyone and he did not leave a message.  He recently had colonoscopy and he is having bad abdominal pain and has passed bright red blood, he said more than he had passed before his procedure.  He rates the abdominal pain at a 7-8 now and I told him if it gets worse to go to the ED, also if he starts losing more blood.  Please advise! ( He said he has not heard from the test that was done after his procedure).

## 2014-04-26 NOTE — Telephone Encounter (Signed)
Called and informed pt. He said he has passed more blood so he will go to the ED.

## 2014-04-27 ENCOUNTER — Telehealth: Payer: Self-pay | Admitting: Gastroenterology

## 2014-04-27 NOTE — Telephone Encounter (Signed)
NIC'D FOR OV AND COLONOSCOPY

## 2014-04-27 NOTE — Telephone Encounter (Signed)
PLEASE CALL PT. HE HAD 3 NOT 2 SIMPLE ADENOMAS REMOVED. HE NEEDS A REPEAT TCS IN 3 YEARS. YOUR SISTERS, BROTHERS, CHILDREN, AND PARENTS NEED TO HAVE A COLONOSCOPY STARTING AT THE AGE OF 30. PT NEED REPEAT CBC IF HE IS STILL HAVING BLEEDING. HE NEEDS REFERRAL TO [facility] FOR OBSCURE GI BLEED/DOUBLE BALLOON ENTEROSCOPY. [facility] W/ SLF IN ONE MO E30 OBSCURE GI BLEED/MELENA.

## 2014-04-27 NOTE — Telephone Encounter (Signed)
Called and informed pt. He said he is better today than yesterday when he called.

## 2014-04-27 NOTE — Telephone Encounter (Signed)
Referral has been made to [facility]  

## 2014-05-17 NOTE — Telephone Encounter (Signed)
I called [facility] to check on referral and they are looking over the CD with the Capsule Study. They will get back with [place] on Monday

## 2014-05-23 ENCOUNTER — Telehealth: Payer: Self-pay | Admitting: General Practice

## 2014-05-23 NOTE — Telephone Encounter (Signed)
Message copied by Idamae Schuller on Tue May 23, 2014 10:30 AM ------      Message from: Danie Binder      Created: Mon May 22, 2014  3:19 PM       OK      ----- Message -----         From: Marlou Porch, CMA         Sent: 05/22/2014   9:43 AM           To: Danie Binder, MD            Dr.Gilliam from [facility] stated that the he does not see no need for DBE. I put the fax from his office in your chair.            Thanks       Ginger       ------

## 2014-06-05 ENCOUNTER — Encounter: Payer: Self-pay | Admitting: Gastroenterology

## 2017-04-06 ENCOUNTER — Encounter: Payer: Self-pay | Admitting: Gastroenterology

## 2018-02-24 ENCOUNTER — Encounter (HOSPITAL_COMMUNITY): Payer: Self-pay | Admitting: Emergency Medicine

## 2018-02-24 ENCOUNTER — Emergency Department (HOSPITAL_COMMUNITY)
Admission: EM | Admit: 2018-02-24 | Discharge: 2018-02-24 | Disposition: A | Discharge status: Home / Self Care | Payer: Self-pay | Attending: Emergency Medicine | Admitting: Emergency Medicine

## 2018-02-24 DIAGNOSIS — Z87891 Personal history of nicotine dependence: Secondary | ICD-10-CM | POA: Insufficient documentation

## 2018-02-24 DIAGNOSIS — I10 Essential (primary) hypertension: Secondary | ICD-10-CM | POA: Insufficient documentation

## 2018-02-24 DIAGNOSIS — J020 Streptococcal pharyngitis: Secondary | ICD-10-CM | POA: Insufficient documentation

## 2018-02-24 LAB — GROUP A STREP BY PCR: Group A Strep by PCR: DETECTED — AB

## 2018-02-24 MED ORDER — DEXAMETHASONE SODIUM PHOSPHATE 10 MG/ML IJ SOLN
10.0000 mg | Freq: Once | INTRAMUSCULAR | Status: AC
  Administered 2018-02-24: 10 mg via INTRAMUSCULAR
  Filled 2018-02-24: qty 1

## 2018-02-24 MED ORDER — PENICILLIN G BENZATHINE 1200000 UNIT/2ML IM SUSP
1.2000 10*6.[IU] | Freq: Once | INTRAMUSCULAR | Status: AC
  Administered 2018-02-24: 1.2 10*6.[IU] via INTRAMUSCULAR
  Filled 2018-02-24: qty 2

## 2018-02-24 NOTE — ED Provider Notes (Signed)
[facility] EMERGENCY DEPARTMENT Provider Note   CSN: [id number] Arrival date & time: 02/24/18  1012     History   Chief Complaint Chief Complaint  Patient presents with  . Generalized Body Aches    HPI Rodney Valdez is a 37 y.o. male with history of hypertension presents for evaluation of acute onset, progressively worsening sore throat, myalgias, and subjective fevers and chills for 3 days.  Denies drooling or facial swelling.  He denies nasal congestion.  Has mild nonproductive cough.  Denies shortness of breath, chest pain, abdominal pain, nausea, or vomiting.  His fiance has similar symptoms.  Has tried Alka-Seltzer and Tylenol with mild temporary relief.  The history is provided by the patient.    Past Medical History:  Diagnosis Date  . Hypertension   . Varicose veins of both lower extremities    SUPERFICIAL CLOTS?    Patient Active Problem List   Diagnosis Date Noted  . Unspecified gastritis and gastroduodenitis without mention of hemorrhage 04/18/2014  . GI bleed 04/16/2014  . Melena 04/16/2014  . Abdominal pain, other specified site 04/16/2014  . Acute blood loss anemia 04/16/2014  . Hypertension     Past Surgical History:  Procedure Laterality Date  . COLONOSCOPY WITH PROPOFOL N/A 04/18/2014   Procedure: COLONOSCOPY WITH PROPOFOL;  Surgeon: Danie Binder, MD;  Location: [facility];  Service: Endoscopy;  Laterality: N/A;  Cecum time in 1320   time out 1338 ,  total time 18 minutes  . ENTEROSCOPY N/A 04/18/2014   Procedure:  ENTEROSCOPY WITH BIOPSYS;  Surgeon: Danie Binder, MD;  Location: [facility];  Service: Endoscopy;  Laterality: N/A;  . ESOPHAGOGASTRODUODENOSCOPY N/A 04/16/2014   Procedure: ESOPHAGOGASTRODUODENOSCOPY (EGD);  Surgeon: Danie Binder, MD;  Location: [facility];  Service: Endoscopy;  Laterality: N/A;  . GIVENS CAPSULE STUDY N/A 04/16/2014   Procedure: GIVENS CAPSULE STUDY;  Surgeon: Danie Binder, MD;  Location: [facility];  Service:  Endoscopy;  Laterality: N/A;  . HERNIA REPAIR  WB  . POLYPECTOMY N/A 04/18/2014   Procedure: POLYPECTOMY;  Surgeon: Danie Binder, MD;  Location: [facility];  Service: Endoscopy;  Laterality: N/A;        Home Medications    Prior to Admission medications   Medication Sig Start Date End Date Taking? Authorizing Provider  HYDROcodone-acetaminophen (NORCO/VICODIN) 5-325 MG per tablet Take 1 tablet by mouth every 4 (four) hours as needed for moderate pain. 12/26/13   Lily Kocher, PA-C  methocarbamol (ROBAXIN) 500 MG tablet Take 1 tablet (500 mg total) by mouth 3 (three) times daily. 12/26/13   Lily Kocher, PA-C  pantoprazole (PROTONIX) 40 MG tablet Take 1 tablet (40 mg total) by mouth 2 (two) times daily before a meal. 04/18/14   Dhungel, Flonnie Overman, MD    Family History Family History  Problem Relation Age of Onset  . Colon cancer Unknown        NEGATIVE  . Colon polyps Unknown        NEGATIVE  . Bleeding Disorder Unknown        NEGATIVE    Social History Social History   Tobacco Use  . Smoking status: Former Smoker    Packs/day: 0.50    Last attempt to quit: 11/25/2017    Years since quitting: 0.2  . Smokeless tobacco: Never Used  Substance Use Topics  . Alcohol use: No  . Drug use: No     Allergies   Patient has no known allergies.  Review of Systems Review of Systems  Constitutional: Positive for chills and fever.  HENT: Positive for sore throat. Negative for congestion, drooling, facial swelling and trouble swallowing.   Respiratory: Negative for shortness of breath.   Cardiovascular: Negative for chest pain.     Physical Exam Updated Vital Signs BP (!) 129/101 (BP Location: Right Arm)   Pulse 61   Temp 98.7 F (37.1 C) (Oral)   Resp 15   Ht 5\' 6"  (1.676 m)   Wt 93 kg (205 lb)   SpO2 97%   BMI 33.09 kg/m   Physical Exam  Constitutional: He appears well-developed and well-nourished. No distress.  HENT:  Head: Normocephalic and atraumatic.  Right Ear:  Tympanic membrane, external ear and ear canal normal.  Left Ear: Tympanic membrane, external ear and ear canal normal.  Nose: Nose normal. No mucosal edema. Right sinus exhibits no maxillary sinus tenderness and no frontal sinus tenderness. Left sinus exhibits no maxillary sinus tenderness and no frontal sinus tenderness.  Mouth/Throat: Uvula is midline and mucous membranes are normal. No trismus in the jaw. No uvula swelling. Posterior oropharyngeal erythema present. Tonsils are 3+ on the right. Tonsils are 3+ on the left. Tonsillar exudate.  Eyes: Conjunctivae are normal. Right eye exhibits no discharge. Left eye exhibits no discharge.  Neck: Normal range of motion. Neck supple. No JVD present. No tracheal deviation present.  Bilateral anterior cervical lymphadenopathy  Cardiovascular: Normal rate.  Pulmonary/Chest: Effort normal.  Abdominal: He exhibits no distension.  Musculoskeletal: He exhibits no edema.  Lymphadenopathy:    He has cervical adenopathy.  Neurological: He is alert.  Skin: No erythema.  Psychiatric: He has a normal mood and affect. His behavior is normal.  Nursing note and vitals reviewed.    ED Treatments / Results  Labs (all labs ordered are listed, but only abnormal results are displayed) Labs Reviewed  GROUP A STREP BY PCR - Abnormal; Notable for the following components:      Result Value   Group A Strep by PCR DETECTED (*)    All other components within normal limits    EKG None  Radiology No results found.  Procedures Procedures (including critical care time)  Medications Ordered in ED Medications  penicillin g benzathine (BICILLIN LA) 1200000 UNIT/2ML injection 1.2 Million Units (has no administration in time range)  dexamethasone (DECADRON) injection 10 mg (has no administration in time range)     Initial Impression / Assessment and Plan / ED Course  I have reviewed the triage vital signs and the nursing notes.  Pertinent labs & imaging  results that were available during my care of the patient were reviewed by me and considered in my medical decision making (see chart for details).    Patient with complaint of sore throat and myalgias as well as fevers and chills for 3 days.  He is afebrile but has with tonsillar exudate, cervical lymphadenopathy, & dysphagia; strep test today was positive. Treated in the ED with steroids and PCN IM.  Pt appears mildly dehydrated, discussed importance of water rehydration. Presentation non concerning for PTA or infxn spread to soft tissue. No trismus or uvula deviation. Specific return precautions discussed. Pt able to drink water in ED without difficulty with intact air way. Recommended PCP follow up. Pt verbalized understanding of and agreement with plan and is safe for discharge home at this time.    Final Clinical Impressions(s) / ED Diagnoses   Final diagnoses:  Strep pharyngitis    ED  Discharge Orders    None       Debroah Baller 02/24/18 1224    Isla Pence, MD 02/24/18 [phone]

## 2018-02-24 NOTE — Discharge Instructions (Signed)
You were treated for strep throat today with penicillin.  Alternate 600 mg of ibuprofen and [phone] mg of Tylenol every 3 hours as needed for pain and fever. Do not exceed 4000 mg of Tylenol daily.  Take ibuprofen with food to avoid upset stomach issues.  Drink plenty of water and get plenty of rest.  Use warm water salt gargles, warm teas, honey, Chloraseptic spray over-the-counter, and throat lozenges for management of your sore throat pain.  Follow-up with your PCP in 3 to 5 days if symptoms persist.  Return to the emergency department immediately for any concerning signs or symptoms develop such as fever not controlled by medication, persistent vomiting, throat tightness, drooling.

## 2018-02-24 NOTE — ED Triage Notes (Signed)
Pt reports generalized body aches and fever for 3 days. Used Tylenol and OTC cold medication with some relief. No fever.

## 2018-08-26 ENCOUNTER — Other Ambulatory Visit: Payer: Self-pay

## 2018-08-26 ENCOUNTER — Encounter (HOSPITAL_COMMUNITY): Payer: Self-pay | Admitting: Emergency Medicine

## 2018-08-26 ENCOUNTER — Emergency Department (HOSPITAL_COMMUNITY)
Admission: EM | Admit: 2018-08-26 | Discharge: 2018-08-26 | Disposition: A | Discharge status: Home / Self Care | Payer: Self-pay | Attending: Emergency Medicine | Admitting: Emergency Medicine

## 2018-08-26 DIAGNOSIS — Z87891 Personal history of nicotine dependence: Secondary | ICD-10-CM | POA: Insufficient documentation

## 2018-08-26 DIAGNOSIS — R519 Headache, unspecified: Secondary | ICD-10-CM

## 2018-08-26 DIAGNOSIS — I10 Essential (primary) hypertension: Secondary | ICD-10-CM | POA: Insufficient documentation

## 2018-08-26 DIAGNOSIS — R51 Headache: Secondary | ICD-10-CM | POA: Insufficient documentation

## 2018-08-26 DIAGNOSIS — Z79899 Other long term (current) drug therapy: Secondary | ICD-10-CM | POA: Insufficient documentation

## 2018-08-26 HISTORY — DX: Migraine, unspecified, not intractable, without status migrainosus: G43.909

## 2018-08-26 MED ORDER — DIPHENHYDRAMINE HCL 50 MG/ML IJ SOLN
12.5000 mg | Freq: Once | INTRAMUSCULAR | Status: AC
Start: 2018-08-26 — End: 2018-08-26
  Administered 2018-08-26: 12.5 mg via INTRAVENOUS
  Filled 2018-08-26: qty 1

## 2018-08-26 MED ORDER — PSEUDOEPHEDRINE HCL 60 MG PO TABS
60.0000 mg | ORAL_TABLET | Freq: Once | ORAL | Status: AC
  Administered 2018-08-26: 60 mg via ORAL
  Filled 2018-08-26: qty 1

## 2018-08-26 MED ORDER — PROCHLORPERAZINE EDISYLATE 10 MG/2ML IJ SOLN
5.0000 mg | Freq: Once | INTRAMUSCULAR | Status: AC
  Administered 2018-08-26: 5 mg via INTRAVENOUS
  Filled 2018-08-26: qty 2

## 2018-08-26 NOTE — ED Notes (Signed)
Patient given discharge instruction, verbalized understand. Patient ambulatory out of the department.  

## 2018-08-26 NOTE — Discharge Instructions (Addendum)
Your vital signs have been reviewed.  Your oxygen level is 97% on room air, which is within normal limits.  Your examination is negative for any acute neurologic deficits.  The medication treatments you received for your headache may cause drowsiness, and/or lightheadedness.  Please use caution getting around tonight and this evening.  Please see Dr.Fusco in the office for follow-up and recheck, as well as management of your headaches.

## 2018-08-26 NOTE — ED Provider Notes (Signed)
[facility] EMERGENCY DEPARTMENT Provider Note   CSN: [id number] Arrival date & time: 08/26/18  1313     History   Chief Complaint Chief Complaint  Patient presents with  . Migraine    HPI Rodney Valdez is a 38 y.o. male.  The history is provided by the patient.  Migraine  This is a recurrent problem. The current episode started more than 2 days ago. The problem occurs daily. The problem has been gradually worsening. Associated symptoms include headaches. Pertinent negatives include no chest pain, no abdominal pain and no shortness of breath. Nothing aggravates the symptoms. Nothing relieves the symptoms. He has tried acetaminophen for the symptoms. The treatment provided no relief.    Past Medical History:  Diagnosis Date  . Hypertension   . Migraine   . Varicose veins of both lower extremities    SUPERFICIAL CLOTS?    Patient Active Problem List   Diagnosis Date Noted  . Unspecified gastritis and gastroduodenitis without mention of hemorrhage 04/18/2014  . GI bleed 04/16/2014  . Melena 04/16/2014  . Abdominal pain, other specified site 04/16/2014  . Acute blood loss anemia 04/16/2014  . Hypertension     Past Surgical History:  Procedure Laterality Date  . APPENDECTOMY    . COLONOSCOPY WITH PROPOFOL N/A 04/18/2014   Procedure: COLONOSCOPY WITH PROPOFOL;  Surgeon: Danie Binder, MD;  Location: [facility];  Service: Endoscopy;  Laterality: N/A;  Cecum time in 1320   time out 1338 ,  total time 18 minutes  . ENTEROSCOPY N/A 04/18/2014   Procedure:  ENTEROSCOPY WITH BIOPSYS;  Surgeon: Danie Binder, MD;  Location: [facility];  Service: Endoscopy;  Laterality: N/A;  . ESOPHAGOGASTRODUODENOSCOPY N/A 04/16/2014   Procedure: ESOPHAGOGASTRODUODENOSCOPY (EGD);  Surgeon: Danie Binder, MD;  Location: [facility];  Service: Endoscopy;  Laterality: N/A;  . GIVENS CAPSULE STUDY N/A 04/16/2014   Procedure: GIVENS CAPSULE STUDY;  Surgeon: Danie Binder, MD;  Location: [facility];   Service: Endoscopy;  Laterality: N/A;  . HERNIA REPAIR  WB  . POLYPECTOMY N/A 04/18/2014   Procedure: POLYPECTOMY;  Surgeon: Danie Binder, MD;  Location: [facility];  Service: Endoscopy;  Laterality: N/A;        Home Medications    Prior to Admission medications   Medication Sig Start Date End Date Taking? Authorizing Provider  HYDROcodone-acetaminophen (NORCO/VICODIN) 5-325 MG per tablet Take 1 tablet by mouth every 4 (four) hours as needed for moderate pain. 12/26/13   Lily Kocher, PA-C  methocarbamol (ROBAXIN) 500 MG tablet Take 1 tablet (500 mg total) by mouth 3 (three) times daily. 12/26/13   Lily Kocher, PA-C  pantoprazole (PROTONIX) 40 MG tablet Take 1 tablet (40 mg total) by mouth 2 (two) times daily before a meal. 04/18/14   Dhungel, Flonnie Overman, MD    Family History Family History  Problem Relation Age of Onset  . Colon cancer Other        NEGATIVE  . Colon polyps Other        NEGATIVE  . Bleeding Disorder Other        NEGATIVE    Social History Social History   Tobacco Use  . Smoking status: Former Smoker    Packs/day: 0.50    Last attempt to quit: 11/25/2017    Years since quitting: 0.7  . Smokeless tobacco: Never Used  Substance Use Topics  . Alcohol use: No  . Drug use: No     Allergies   Patient  has no known allergies.   Review of Systems Review of Systems  Constitutional: Positive for activity change and appetite change. Negative for chills and fever.       All ROS Neg except as noted in HPI  HENT: Positive for congestion. Negative for nosebleeds, sneezing and sore throat.   Eyes: Negative for photophobia and discharge.  Respiratory: Negative for cough, shortness of breath and wheezing.   Cardiovascular: Negative for chest pain and palpitations.  Gastrointestinal: Positive for nausea. Negative for abdominal pain, blood in stool, diarrhea and vomiting.  Genitourinary: Negative for dysuria, frequency and hematuria.  Musculoskeletal: Negative for  arthralgias, back pain, myalgias and neck pain.  Skin: Negative.   Neurological: Positive for headaches. Negative for dizziness, seizures and speech difficulty.  Psychiatric/Behavioral: Negative for confusion and hallucinations.     Physical Exam Updated Vital Signs BP (!) 154/106 (BP Location: Right Arm)   Pulse 71   Temp 98.3 F (36.8 C) (Oral)   Resp 18   Ht 5\' 7"  (1.702 m)   Wt 93 kg   SpO2 99%   BMI 32.11 kg/m   Physical Exam Vitals signs and nursing note reviewed.  Constitutional:      General: He is not in acute distress.    Appearance: He is well-developed.  HENT:     Head: Normocephalic and atraumatic.     Right Ear: External ear normal.     Left Ear: External ear normal.  Eyes:     General: No scleral icterus.       Right eye: No discharge.        Left eye: No discharge.     Conjunctiva/sclera: Conjunctivae normal.  Neck:     Musculoskeletal: Neck supple.     Trachea: No tracheal deviation.  Cardiovascular:     Rate and Rhythm: Normal rate and regular rhythm.  Pulmonary:     Effort: Pulmonary effort is normal. No respiratory distress.     Breath sounds: Normal breath sounds. No stridor. No wheezing or rales.  Abdominal:     General: Bowel sounds are normal. There is no distension.     Palpations: Abdomen is soft.     Tenderness: There is no abdominal tenderness. There is no guarding or rebound.  Musculoskeletal:        General: No tenderness.  Skin:    General: Skin is warm and dry.     Findings: No rash.  Neurological:     Mental Status: He is alert.     Cranial Nerves: No cranial nerve deficit (no facial droop, extraocular movements intact, no slurred speech).     Sensory: No sensory deficit.     Motor: No abnormal muscle tone or seizure activity.     Coordination: Coordination normal.      ED Treatments / Results  Labs (all labs ordered are listed, but only abnormal results are displayed) Labs Reviewed - No data to  display  EKG None  Radiology No results found.  Procedures Procedures (including critical care time)  Medications Ordered in ED Medications  pseudoephedrine (SUDAFED) tablet 60 mg (has no administration in time range)  prochlorperazine (COMPAZINE) injection 5 mg (has no administration in time range)  diphenhydrAMINE (BENADRYL) injection 12.5 mg (has no administration in time range)     Initial Impression / Assessment and Plan / ED Course  I have reviewed the triage vital signs and the nursing notes.  Pertinent labs & imaging results that were available during my care of the patient  were reviewed by me and considered in my medical decision making (see chart for details).       Final Clinical Impressions(s) / ED Diagnoses MDM  Patient presents to the emergency department with a complaint of migraine headache.  The patient states that he has been having some congestion and fever.  He has headache of the right side that will not respond to conservative measures.  He says he has no other symptoms of flu.  The patient is prescribed Sudafed.  IV Compazine and Benadryl.  Will observe the patient for resolution of the headache.  Patient has had problems with GI bleed in the past, and no Toradol was ordered.  Recheck.  Patient states he feels tremendously better.  His headache is down to about a 2.  The nausea is completely resolved.  The pupils are equal and reactive to light.  There is no facial weakness noted.  Speech is clear.  Grip is symmetrical.  Motor strength of upper and lower extremities as well as sensory are symmetrical.  Gait is steady.  I have asked the patient to see Dr.Fusco in the office as soon as possible for follow-up and recheck.  I have invited the patient to return to the emergency department if any return of severe headache, changes in condition, problems, or concerns.  The patient is in agreement with this plan.   Final diagnoses:  Bad headache    ED  Discharge Orders    None       Lily Kocher, Hershal Coria 08/26/18 1849    Maudie Flakes, MD 08/27/18 [phone]

## 2018-08-26 NOTE — ED Triage Notes (Signed)
Patient complaining of headache and nausea x 6 days. States he has history of migraines.

## 2018-08-30 ENCOUNTER — Emergency Department (HOSPITAL_COMMUNITY): Payer: Self-pay

## 2018-08-30 ENCOUNTER — Encounter (HOSPITAL_COMMUNITY): Payer: Self-pay | Admitting: Emergency Medicine

## 2018-08-30 ENCOUNTER — Emergency Department (HOSPITAL_COMMUNITY)
Admission: EM | Admit: 2018-08-30 | Discharge: 2018-08-30 | Disposition: A | Discharge status: Home / Self Care | Payer: Self-pay | Attending: Emergency Medicine | Admitting: Emergency Medicine

## 2018-08-30 DIAGNOSIS — J341 Cyst and mucocele of nose and nasal sinus: Secondary | ICD-10-CM | POA: Insufficient documentation

## 2018-08-30 DIAGNOSIS — R519 Headache, unspecified: Secondary | ICD-10-CM

## 2018-08-30 DIAGNOSIS — I10 Essential (primary) hypertension: Secondary | ICD-10-CM | POA: Insufficient documentation

## 2018-08-30 DIAGNOSIS — Z87891 Personal history of nicotine dependence: Secondary | ICD-10-CM | POA: Insufficient documentation

## 2018-08-30 DIAGNOSIS — R51 Headache: Secondary | ICD-10-CM | POA: Insufficient documentation

## 2018-08-30 DIAGNOSIS — Z79899 Other long term (current) drug therapy: Secondary | ICD-10-CM | POA: Insufficient documentation

## 2018-08-30 LAB — CBC WITH DIFFERENTIAL/PLATELET
Abs Immature Granulocytes: 0.01 10*3/uL (ref 0.00–0.07)
BASOS ABS: 0.1 10*3/uL (ref 0.0–0.1)
Basophils Relative: 1 %
EOS ABS: 0.2 10*3/uL (ref 0.0–0.5)
Eosinophils Relative: 4 %
HCT: 44.3 % (ref 39.0–52.0)
Hemoglobin: 14.5 g/dL (ref 13.0–17.0)
Immature Granulocytes: 0 %
Lymphocytes Relative: 27 %
Lymphs Abs: 1.6 10*3/uL (ref 0.7–4.0)
MCH: 27.9 pg (ref 26.0–34.0)
MCHC: 32.7 g/dL (ref 30.0–36.0)
MCV: 85.4 fL (ref 80.0–100.0)
MONO ABS: 0.7 10*3/uL (ref 0.1–1.0)
Monocytes Relative: 11 %
NRBC: 0 % (ref 0.0–0.2)
Neutro Abs: 3.5 10*3/uL (ref 1.7–7.7)
Neutrophils Relative %: 57 %
Platelets: 256 10*3/uL (ref 150–400)
RBC: 5.19 MIL/uL (ref 4.22–5.81)
RDW: 13.2 % (ref 11.5–15.5)
WBC: 6.2 10*3/uL (ref 4.0–10.5)

## 2018-08-30 LAB — BASIC METABOLIC PANEL
Anion gap: 8 (ref 5–15)
BUN: 16 mg/dL (ref 6–20)
CHLORIDE: 102 mmol/L (ref 98–111)
CO2: 26 mmol/L (ref 22–32)
Calcium: 9.1 mg/dL (ref 8.9–10.3)
Creatinine, Ser: 0.81 mg/dL (ref 0.61–1.24)
GFR calc Af Amer: >60 mL/min (ref >60)
GFR calc non Af Amer: >60 mL/min (ref >60)
Glucose, Bld: 112 mg/dL — ABNORMAL HIGH (ref 70–99)
Potassium: 4 mmol/L (ref 3.5–5.1)
SODIUM: 136 mmol/L (ref 135–145)

## 2018-08-30 MED ORDER — ONDANSETRON HCL 4 MG/2ML IJ SOLN
4.0000 mg | Freq: Once | INTRAMUSCULAR | Status: AC
  Administered 2018-08-30: 4 mg via INTRAVENOUS
  Filled 2018-08-30: qty 2

## 2018-08-30 MED ORDER — PROCHLORPERAZINE EDISYLATE 10 MG/2ML IJ SOLN
10.0000 mg | Freq: Once | INTRAMUSCULAR | Status: AC
  Administered 2018-08-30: 10 mg via INTRAVENOUS
  Filled 2018-08-30: qty 2

## 2018-08-30 MED ORDER — DIPHENHYDRAMINE HCL 50 MG/ML IJ SOLN
25.0000 mg | Freq: Once | INTRAMUSCULAR | Status: AC
  Administered 2018-08-30: 25 mg via INTRAVENOUS
  Filled 2018-08-30: qty 1

## 2018-08-30 MED ORDER — HYDROMORPHONE HCL 1 MG/ML IJ SOLN: 1.0000 mg | Freq: Once | INTRAMUSCULAR | Status: DC

## 2018-08-30 MED ORDER — DEXAMETHASONE SODIUM PHOSPHATE 10 MG/ML IJ SOLN
10.0000 mg | Freq: Once | INTRAMUSCULAR | Status: AC
  Administered 2018-08-30: 10 mg via INTRAVENOUS
  Filled 2018-08-30: qty 1

## 2018-08-30 MED ORDER — HYDROCODONE-ACETAMINOPHEN 5-325 MG PO TABS: 1.0000 | ORAL_TABLET | ORAL | 0 refills | Status: DC | PRN

## 2018-08-30 MED ORDER — HYDROMORPHONE HCL 1 MG/ML IJ SOLN
0.5000 mg | Freq: Once | INTRAMUSCULAR | Status: AC
  Administered 2018-08-30: 0.5 mg via INTRAVENOUS
  Filled 2018-08-30: qty 1

## 2018-08-30 NOTE — ED Triage Notes (Signed)
Pt states he was here on Thursday for headache and felt better, headache returned over right eye on Friday morning.  Has been taking Motrin, Tylenol, and Excedrin with no relief.

## 2018-08-30 NOTE — Discharge Instructions (Signed)
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  This could be referred pain from the sinus.  You may use the medication prescribed if needed for pain relief, however be aware that this medication will make you drowsy.  Do not drive within 4 hours of taking it.  Call Dr. Constance Holster for further evaluation of this finding in your sinus.

## 2018-08-30 NOTE — ED Provider Notes (Addendum)
[facility] EMERGENCY DEPARTMENT Provider Note   CSN: [id number] Arrival date & time: 08/30/18  1751     History   Chief Complaint Chief Complaint  Patient presents with  . Headache    HPI Rodney Valdez is a 38 y.o. male with a history of HTN and migraines presenting with persistent, waxing and waning right frontal and posterior right eye pain which has been present for the past 1+ week.  He endorses headache is in the same general location of prior migraines but this one has lasted longer than most.  He endorses nausea without emesis, no neck pain or stiffness, no focal weakness or dizziness.  He has noted subjective fever along with nasal congestion which has improved since taking pseudoephedrine x 4 days.Marland Kitchen  He denies prodromal scotoma but does have photophobia, no other vision changes.  He was seen here 4 days ago and obtained relief from treatment but it has returned.  He does not take migraine specific meds at home, but has taken .  The history is provided by the patient.    Past Medical History:  Diagnosis Date  . Hypertension   . Migraine   . Varicose veins of both lower extremities    SUPERFICIAL CLOTS?    Patient Active Problem List   Diagnosis Date Noted  . Unspecified gastritis and gastroduodenitis without mention of hemorrhage 04/18/2014  . GI bleed 04/16/2014  . Melena 04/16/2014  . Abdominal pain, other specified site 04/16/2014  . Acute blood loss anemia 04/16/2014  . Hypertension     Past Surgical History:  Procedure Laterality Date  . APPENDECTOMY    . COLONOSCOPY WITH PROPOFOL N/A 04/18/2014   Procedure: COLONOSCOPY WITH PROPOFOL;  Surgeon: Danie Binder, MD;  Location: [facility];  Service: Endoscopy;  Laterality: N/A;  Cecum time in 1320   time out 1338 ,  total time 18 minutes  . ENTEROSCOPY N/A 04/18/2014   Procedure:  ENTEROSCOPY WITH BIOPSYS;  Surgeon: Danie Binder, MD;  Location: [facility];  Service: Endoscopy;  Laterality: N/A;  .  ESOPHAGOGASTRODUODENOSCOPY N/A 04/16/2014   Procedure: ESOPHAGOGASTRODUODENOSCOPY (EGD);  Surgeon: Danie Binder, MD;  Location: [facility];  Service: Endoscopy;  Laterality: N/A;  . GIVENS CAPSULE STUDY N/A 04/16/2014   Procedure: GIVENS CAPSULE STUDY;  Surgeon: Danie Binder, MD;  Location: [facility];  Service: Endoscopy;  Laterality: N/A;  . HERNIA REPAIR  WB  . POLYPECTOMY N/A 04/18/2014   Procedure: POLYPECTOMY;  Surgeon: Danie Binder, MD;  Location: [facility];  Service: Endoscopy;  Laterality: N/A;        Home Medications    Prior to Admission medications   Medication Sig Start Date End Date Taking? Authorizing Provider  HYDROcodone-acetaminophen (NORCO/VICODIN) 5-325 MG tablet Take 1 tablet by mouth every 4 (four) hours as needed. 08/30/18   Evalee Jefferson, PA-C  methocarbamol (ROBAXIN) 500 MG tablet Take 1 tablet (500 mg total) by mouth 3 (three) times daily. 12/26/13   Lily Kocher, PA-C  pantoprazole (PROTONIX) 40 MG tablet Take 1 tablet (40 mg total) by mouth 2 (two) times daily before a meal. 04/18/14   Dhungel, Flonnie Overman, MD    Family History Family History  Problem Relation Age of Onset  . Colon cancer Other        NEGATIVE  . Colon polyps Other        NEGATIVE  . Bleeding Disorder Other        NEGATIVE    Social History  Social History   Tobacco Use  . Smoking status: Former Smoker    Packs/day: 0.50    Last attempt to quit: 11/25/2017    Years since quitting: 0.7  . Smokeless tobacco: Never Used  Substance Use Topics  . Alcohol use: No  . Drug use: No     Allergies   Patient has no known allergies.   Review of Systems Review of Systems  Constitutional: Positive for fever.  HENT: Negative for congestion and sore throat.   Eyes: Negative.   Respiratory: Negative for chest tightness and shortness of breath.   Cardiovascular: Negative for chest pain.  Gastrointestinal: Positive for nausea. Negative for abdominal pain and vomiting.  Genitourinary:  Negative.   Musculoskeletal: Negative for arthralgias, joint swelling, neck pain and neck stiffness.  Skin: Negative.  Negative for rash and wound.  Neurological: Positive for headaches. Negative for dizziness, weakness, light-headedness and numbness.  Psychiatric/Behavioral: Negative.      Physical Exam Updated Vital Signs BP (!) 144/108 (BP Location: Left Arm)   Pulse 70   Temp 97.7 F (36.5 C) (Oral)   Resp 18   Ht 5\' 7"  (1.702 m)   Wt 93 kg   SpO2 100%   BMI 32.11 kg/m   Physical Exam Vitals signs and nursing note reviewed.  Constitutional:      Appearance: He is well-developed.     Comments: Uncomfortable appearing  HENT:     Head: Normocephalic and atraumatic.     Nose: No congestion.     Right Nostril: No occlusion.     Left Nostril: No occlusion.     Right Sinus: No maxillary sinus tenderness or frontal sinus tenderness.     Mouth/Throat:     Mouth: Mucous membranes are moist.  Eyes:     Extraocular Movements: Extraocular movements intact.     Pupils: Pupils are equal, round, and reactive to light.  Neck:     Musculoskeletal: Normal range of motion and neck supple.  Cardiovascular:     Rate and Rhythm: Normal rate.     Heart sounds: Normal heart sounds.  Pulmonary:     Effort: Pulmonary effort is normal.  Abdominal:     Palpations: Abdomen is soft.     Tenderness: There is no abdominal tenderness.  Musculoskeletal: Normal range of motion.  Lymphadenopathy:     Cervical: No cervical adenopathy.  Skin:    General: Skin is warm and dry.     Findings: No rash.  Neurological:     Mental Status: He is alert and oriented to person, place, and time.     GCS: GCS eye subscore is 4. GCS verbal subscore is 5. GCS motor subscore is 6.     Cranial Nerves: Cranial nerves are intact.     Sensory: No sensory deficit.     Gait: Gait normal.     Comments: Normal heel-shin, normal rapid alternating movements. Cranial nerves III-XII intact.  No pronator drift.    Psychiatric:        Speech: Speech normal.        Behavior: Behavior normal.        Thought Content: Thought content normal.      ED Treatments / Results  Labs (all labs ordered are listed, but only abnormal results are displayed) Labs Reviewed  BASIC METABOLIC PANEL - Abnormal; Notable for the following components:      Result Value   Glucose, Bld 112 (*)    All other components within normal limits  CBC WITH DIFFERENTIAL/PLATELET    EKG None  Radiology Ct Head Wo Contrast  Result Date: 08/30/2018 CLINICAL DATA:  RT SIDE HEADACHE ABOVE RIGHT EYE SINCE 08/21/2018, C/O NAUSEA AND SOME BLURRED VISION. PT STATES HE HAS HISTORY OF MIGRAINES. EXAM: CT HEAD WITHOUT CONTRAST TECHNIQUE: Contiguous axial images were obtained from the base of the skull through the vertex without intravenous contrast. COMPARISON:  None. FINDINGS: Brain: No evidence of acute infarction, hemorrhage, hydrocephalus, extra-axial collection or mass lesion/mass effect. Vascular: No hyperdense vessel or unexpected calcification. Skull: No osseous abnormality. Sinuses/Orbits: Right maxillary sinus mucous retention cyst. Remainder the paranasal sinuses are clear. Visualized mastoid sinuses are clear. Visualized orbits demonstrate no focal abnormality. Other: None IMPRESSION: 1. No acute intracranial pathology. 2. Right maxillary sinus mucous retention cyst. Electronically Signed   By: Kathreen Devoid   On: 08/30/2018 09:19    Procedures Procedures (including critical care time)  Medications Ordered in ED Medications  prochlorperazine (COMPAZINE) injection 10 mg (10 mg Intravenous Given 08/30/18 0845)  diphenhydrAMINE (BENADRYL) injection 25 mg (25 mg Intravenous Given 08/30/18 0845)  dexamethasone (DECADRON) injection 10 mg (10 mg Intravenous Given 08/30/18 0845)  ondansetron (ZOFRAN) injection 4 mg (4 mg Intravenous Given 08/30/18 1050)  HYDROmorphone (DILAUDID) injection 0.5 mg (0.5 mg Intravenous Given 08/30/18 1051)      Initial Impression / Assessment and Plan / ED Course  I have reviewed the triage vital signs and the nursing notes.  Pertinent labs & imaging results that were available during my care of the patient were reviewed by me and considered in my medical decision making (see chart for details).     Pt obtained relief after IV fluids and migraine cocktail.  CT results reviewed and discussed with pt.  Advised f/u with ENT to further evaluate sinus findings.  No acute sinusitis present so no indication for abx at this time.  He was prescribed hydrocodone after reviewing [place] narc database for pain relief. Also discussed warm compresses, continued sudafed.  Prn f/u anticipated.  Final Clinical Impressions(s) / ED Diagnoses   Final diagnoses:  Acute nonintractable headache, unspecified headache type  Mucous retention cyst of maxillary sinus    ED Discharge Orders         Ordered    HYDROcodone-acetaminophen (NORCO/VICODIN) 5-325 MG tablet  Every 4 hours PRN     08/30/18 1110           Evalee Jefferson, PA-C 09/02/18 0838    Evalee Jefferson, PA-C 09/02/18 7741    Milton Ferguson, MD 09/02/18 1547

## 2019-07-23 ENCOUNTER — Emergency Department (HOSPITAL_COMMUNITY): Payer: Medicaid Other

## 2019-07-23 ENCOUNTER — Encounter (HOSPITAL_COMMUNITY): Payer: Self-pay | Admitting: Emergency Medicine

## 2019-07-23 ENCOUNTER — Other Ambulatory Visit: Payer: Self-pay

## 2019-07-23 ENCOUNTER — Emergency Department (HOSPITAL_COMMUNITY)
Admission: EM | Admit: 2019-07-23 | Discharge: 2019-07-23 | Disposition: A | Discharge status: Home / Self Care | Payer: Medicaid Other | Attending: Emergency Medicine | Admitting: Emergency Medicine

## 2019-07-23 DIAGNOSIS — R509 Fever, unspecified: Secondary | ICD-10-CM | POA: Insufficient documentation

## 2019-07-23 DIAGNOSIS — I10 Essential (primary) hypertension: Secondary | ICD-10-CM | POA: Insufficient documentation

## 2019-07-23 DIAGNOSIS — Z20822 Contact with and (suspected) exposure to covid-19: Secondary | ICD-10-CM

## 2019-07-23 DIAGNOSIS — Z79899 Other long term (current) drug therapy: Secondary | ICD-10-CM | POA: Insufficient documentation

## 2019-07-23 DIAGNOSIS — Z20828 Contact with and (suspected) exposure to other viral communicable diseases: Secondary | ICD-10-CM | POA: Insufficient documentation

## 2019-07-23 DIAGNOSIS — M7918 Myalgia, other site: Secondary | ICD-10-CM | POA: Insufficient documentation

## 2019-07-23 DIAGNOSIS — R0789 Other chest pain: Secondary | ICD-10-CM | POA: Insufficient documentation

## 2019-07-23 DIAGNOSIS — Z87891 Personal history of nicotine dependence: Secondary | ICD-10-CM | POA: Insufficient documentation

## 2019-07-23 DIAGNOSIS — R05 Cough: Secondary | ICD-10-CM | POA: Insufficient documentation

## 2019-07-23 LAB — TROPONIN I (HIGH SENSITIVITY)
Troponin I (High Sensitivity): 6 ng/L (ref <18)
Troponin I (High Sensitivity): 7 ng/L (ref <18)

## 2019-07-23 LAB — BASIC METABOLIC PANEL
Anion gap: 8 (ref 5–15)
BUN: 10 mg/dL (ref 6–20)
CO2: 28 mmol/L (ref 22–32)
Calcium: 9.4 mg/dL (ref 8.9–10.3)
Chloride: 102 mmol/L (ref 98–111)
Creatinine, Ser: 0.66 mg/dL (ref 0.61–1.24)
GFR calc Af Amer: >60 mL/min (ref >60)
GFR calc non Af Amer: >60 mL/min (ref >60)
Glucose, Bld: 88 mg/dL (ref 70–99)
Potassium: 4.1 mmol/L (ref 3.5–5.1)
Sodium: 138 mmol/L (ref 135–145)

## 2019-07-23 LAB — CBC WITH DIFFERENTIAL/PLATELET
Abs Immature Granulocytes: 0.01 10*3/uL (ref 0.00–0.07)
Basophils Absolute: 0.1 10*3/uL (ref 0.0–0.1)
Basophils Relative: 1 %
Eosinophils Absolute: 0.2 10*3/uL (ref 0.0–0.5)
Eosinophils Relative: 4 %
HCT: 46.4 % (ref 39.0–52.0)
Hemoglobin: 15.5 g/dL (ref 13.0–17.0)
Immature Granulocytes: 0 %
Lymphocytes Relative: 27 %
Lymphs Abs: 1.7 10*3/uL (ref 0.7–4.0)
MCH: 29 pg (ref 26.0–34.0)
MCHC: 33.4 g/dL (ref 30.0–36.0)
MCV: 86.7 fL (ref 80.0–100.0)
Monocytes Absolute: 0.6 10*3/uL (ref 0.1–1.0)
Monocytes Relative: 9 %
Neutro Abs: 3.7 10*3/uL (ref 1.7–7.7)
Neutrophils Relative %: 59 %
Platelets: 295 10*3/uL (ref 150–400)
RBC: 5.35 MIL/uL (ref 4.22–5.81)
RDW: 13 % (ref 11.5–15.5)
WBC: 6.2 10*3/uL (ref 4.0–10.5)
nRBC: 0 % (ref 0.0–0.2)

## 2019-07-23 LAB — POC SARS CORONAVIRUS 2 AG -  ED: SARS Coronavirus 2 Ag: NEGATIVE

## 2019-07-23 NOTE — ED Triage Notes (Signed)
Pt states that he has had a cough, fever, chest pain,and  bodyaches

## 2019-07-23 NOTE — ED Provider Notes (Signed)
[facility] EMERGENCY DEPARTMENT Provider Note   CSN: [id number] Arrival date & time: 07/23/19  1137     History   Chief Complaint Chief Complaint  Patient presents with  . Cough  . Chest Pain  . Fever    HPI Rodney Valdez is a 38 y.o. male.  He is presenting complaining of chest pressure and soreness since Thursday.  It is been associated with a low-grade fever to a temperature maximum of 101.  He has had a cough sometimes productive of some yellow sputum.  Some achiness in his back.  No loss of taste or smell.  No vomiting or diarrhea.  No sick contacts or recent travel.  No prior history of cardiac disease.  Concerned for Covid.     The history is provided by the patient.  Cough Cough characteristics:  Productive Sputum characteristics:  Yellow Severity:  Moderate Onset quality:  Gradual Duration:  4 days Timing:  Intermittent Progression:  Unchanged Chronicity:  New Relieved by:  None tried Worsened by:  Nothing Associated symptoms: chest pain, fever, myalgias, rhinorrhea and sinus congestion   Associated symptoms: no chills, no headaches, no rash, no shortness of breath and no sore throat   Chest Pain Pain location:  Substernal area Pain quality comment:  Soreness Pain radiates to:  Does not radiate Pain severity:  Mild Onset quality:  Gradual Timing:  Constant Progression:  Unchanged Chronicity:  New Relieved by:  None tried Worsened by:  Nothing Ineffective treatments:  None tried Associated symptoms: back pain, cough and fever   Associated symptoms: no abdominal pain, no dizziness, no dysphagia, no headache, no lower extremity edema, no nausea, no shortness of breath and no vomiting   Fever Associated symptoms: chest pain, cough, myalgias and rhinorrhea   Associated symptoms: no chills, no dysuria, no headaches, no nausea, no rash, no sore throat and no vomiting     Past Medical History:  Diagnosis Date  . Hypertension   . Migraine   . Varicose  veins of both lower extremities    SUPERFICIAL CLOTS?    Patient Active Problem List   Diagnosis Date Noted  . Unspecified gastritis and gastroduodenitis without mention of hemorrhage 04/18/2014  . GI bleed 04/16/2014  . Melena 04/16/2014  . Abdominal pain, other specified site 04/16/2014  . Acute blood loss anemia 04/16/2014  . Hypertension     Past Surgical History:  Procedure Laterality Date  . APPENDECTOMY    . COLONOSCOPY WITH PROPOFOL N/A 04/18/2014   Procedure: COLONOSCOPY WITH PROPOFOL;  Surgeon: Danie Binder, MD;  Location: [facility];  Service: Endoscopy;  Laterality: N/A;  Cecum time in 1320   time out 1338 ,  total time 18 minutes  . ENTEROSCOPY N/A 04/18/2014   Procedure:  ENTEROSCOPY WITH BIOPSYS;  Surgeon: Danie Binder, MD;  Location: [facility];  Service: Endoscopy;  Laterality: N/A;  . ESOPHAGOGASTRODUODENOSCOPY N/A 04/16/2014   Procedure: ESOPHAGOGASTRODUODENOSCOPY (EGD);  Surgeon: Danie Binder, MD;  Location: [facility];  Service: Endoscopy;  Laterality: N/A;  . GIVENS CAPSULE STUDY N/A 04/16/2014   Procedure: GIVENS CAPSULE STUDY;  Surgeon: Danie Binder, MD;  Location: [facility];  Service: Endoscopy;  Laterality: N/A;  . HERNIA REPAIR  WB  . POLYPECTOMY N/A 04/18/2014   Procedure: POLYPECTOMY;  Surgeon: Danie Binder, MD;  Location: [facility];  Service: Endoscopy;  Laterality: N/A;        Home Medications    Prior to Admission medications  Medication Sig Start Date End Date Taking? Authorizing Provider  HYDROcodone-acetaminophen (NORCO/VICODIN) 5-325 MG tablet Take 1 tablet by mouth every 4 (four) hours as needed. 08/30/18   Evalee Jefferson, PA-C  methocarbamol (ROBAXIN) 500 MG tablet Take 1 tablet (500 mg total) by mouth 3 (three) times daily. 12/26/13   Lily Kocher, PA-C  pantoprazole (PROTONIX) 40 MG tablet Take 1 tablet (40 mg total) by mouth 2 (two) times daily before a meal. 04/18/14   Dhungel, Flonnie Overman, MD    Family History Family History  Problem  Relation Age of Onset  . Colon cancer Other        NEGATIVE  . Colon polyps Other        NEGATIVE  . Bleeding Disorder Other        NEGATIVE    Social History Social History   Tobacco Use  . Smoking status: Former Smoker    Packs/day: 0.50    Quit date: 11/25/2017    Years since quitting: 1.6  . Smokeless tobacco: Never Used  Substance Use Topics  . Alcohol use: No  . Drug use: No     Allergies   Patient has no known allergies.   Review of Systems Review of Systems  Constitutional: Positive for fever. Negative for chills.  HENT: Positive for rhinorrhea. Negative for sore throat and trouble swallowing.   Eyes: Negative for visual disturbance.  Respiratory: Positive for cough. Negative for shortness of breath.   Cardiovascular: Positive for chest pain.  Gastrointestinal: Negative for abdominal pain, nausea and vomiting.  Genitourinary: Negative for dysuria.  Musculoskeletal: Positive for back pain and myalgias.  Skin: Negative for rash.  Neurological: Negative for dizziness and headaches.     Physical Exam Updated Vital Signs BP (!) 154/118 (BP Location: Right Arm)   Pulse 80   Temp 98.6 F (37 C) (Oral)   Resp 14   Ht 5\' 7"  (1.702 m)   Wt 99.8 kg   SpO2 100%   BMI 34.46 kg/m   Physical Exam Vitals signs and nursing note reviewed.  Constitutional:      Appearance: He is well-developed.  HENT:     Head: Normocephalic and atraumatic.  Eyes:     Conjunctiva/sclera: Conjunctivae normal.  Neck:     Musculoskeletal: Neck supple.  Cardiovascular:     Rate and Rhythm: Normal rate and regular rhythm.     Heart sounds: Normal heart sounds. No murmur.  Pulmonary:     Effort: Pulmonary effort is normal. No respiratory distress.     Breath sounds: Normal breath sounds.  Abdominal:     Palpations: Abdomen is soft.     Tenderness: There is no abdominal tenderness.  Musculoskeletal: Normal range of motion.     Right lower leg: He exhibits no tenderness.      Left lower leg: He exhibits no tenderness.  Skin:    General: Skin is warm and dry.     Capillary Refill: Capillary refill takes less than 2 seconds.  Neurological:     General: No focal deficit present.     Mental Status: He is alert.      ED Treatments / Results  Labs (all labs ordered are listed, but only abnormal results are displayed) Labs Reviewed  NOVEL CORONAVIRUS, NAA (HOSP ORDER, SEND-OUT TO REF LAB; TAT 18-24 HRS)  BASIC METABOLIC PANEL  CBC WITH DIFFERENTIAL/PLATELET  POC SARS CORONAVIRUS 2 AG -  ED  TROPONIN I (HIGH SENSITIVITY)  TROPONIN I (HIGH SENSITIVITY)    EKG  EKG Interpretation  Date/Time:  Saturday July 23 2019 12:35:01 EST Ventricular Rate:  62 PR Interval:    QRS Duration: 87 QT Interval:  388 QTC Calculation: 394 R Axis:   28 Text Interpretation: Sinus rhythm Inferior infarct, old Baseline wander in lead(s) V2 No acute changes compared with prior 2/09 Confirmed by Aletta Edouard [phone]) on 07/23/2019 12:38:31 PM   Radiology Dg Chest Port 1 View  Result Date: 07/23/2019 CLINICAL DATA:  Chest pain and fever.  Cough. EXAM: PORTABLE CHEST 1 VIEW COMPARISON:  None. FINDINGS: Lungs are clear. Heart size and pulmonary vascularity are normal. No adenopathy. No pneumothorax. No bone lesions. IMPRESSION: No edema or consolidation. Electronically Signed   By: Lowella Grip III M.D.   On: 07/23/2019 12:55    Procedures Procedures (including critical care time)  Medications Ordered in ED Medications - No data to display   Initial Impression / Assessment and Plan / ED Course  I have reviewed the triage vital signs and the nursing notes.  Pertinent labs & imaging results that were available during my care of the patient were reviewed by me and considered in my medical decision making (see chart for details).  Clinical Course as of Jul 22 1654  Sat Jul 22, 5780  3554 38 year old here with chest soreness cough fever.  Differential includes  Covid, pneumonia, sinus infection, URI, bronchitis, ACS   [MB]  1300 Patient's chest x-ray interpreted by me as no gross infiltrates.   [MB]    Clinical Course User Index [MB] Hayden Rasmussen, MD   Rodney Valdez was evaluated in Emergency Department on 07/23/2019 for the symptoms described in the history of present illness. He was evaluated in the context of the global COVID-19 pandemic, which necessitated consideration that the patient might be at risk for infection with the SARS-CoV-2 virus that causes COVID-19. Institutional protocols and algorithms that pertain to the evaluation of patients at risk for COVID-19 are in a state of rapid change based on information released by regulatory bodies including the CDC and federal and state organizations. These policies and algorithms were followed during the patient's care in the ED.      Final Clinical Impressions(s) / ED Diagnoses   Final diagnoses:  Atypical chest pain  Person under investigation for COVID-19    ED Discharge Orders    None       Hayden Rasmussen, MD 07/23/19 1656

## 2019-07-23 NOTE — Discharge Instructions (Signed)
You were evaluated in the emergency department for cough shortness of breath and chest soreness.  Your rapid Covid test was negative.  Your send out Covid test should result within the next day or 2.  You will need to isolate until the results of your test are back.  You had blood work and a chest x-ray that did not show any serious findings.  You should follow-up with your primary care doctor.  Return to the emergency department if any worsening or concerning symptoms.

## 2019-07-24 LAB — NOVEL CORONAVIRUS, NAA (HOSP ORDER, SEND-OUT TO REF LAB; TAT 18-24 HRS): SARS-CoV-2, NAA: NOT DETECTED

## 2020-08-04 IMAGING — CT CT HEAD W/O CM
4 series · 16 of 47 positions shown, 18 images · non-contrast
Comparison: None.

CLINICAL DATA: RT SIDE HEADACHE ABOVE RIGHT EYE SINCE 08/21/2018,
C/O NAUSEA AND SOME BLURRED VISION. PT STATES HE HAS HISTORY OF
MIGRAINES.

EXAM:
CT HEAD WITHOUT CONTRAST
TECHNIQUE: Contiguous axial images were obtained from the base of the skull
through the vertex without intravenous contrast.

[Series 2: head trauma wo · axial · 0.42mm/px · z∈[+28,+143]mm · 7 of 31 slices shown, 9 images]
[im 4/31  brain]
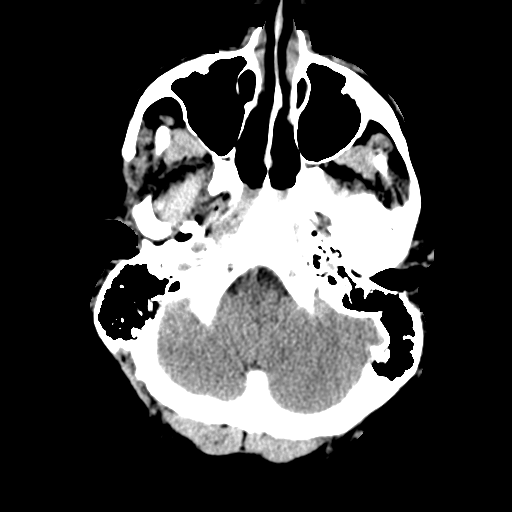
[im 4/31  bone]
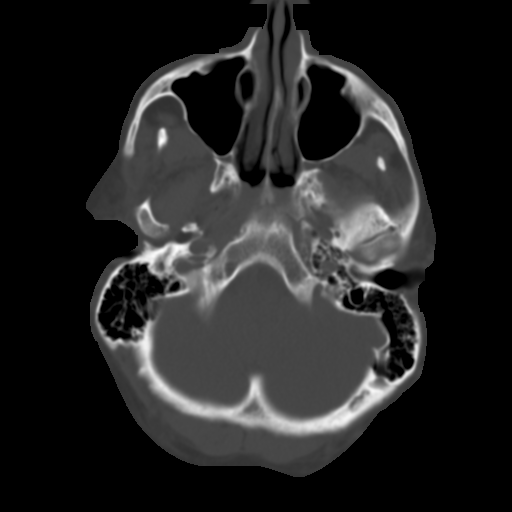
[im 8/31  brain]
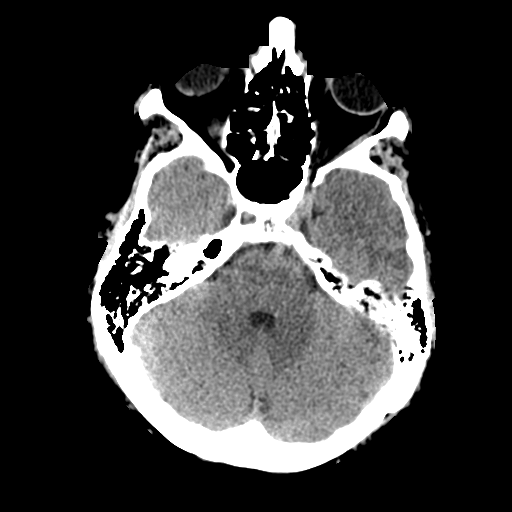
[im 12/31  brain]
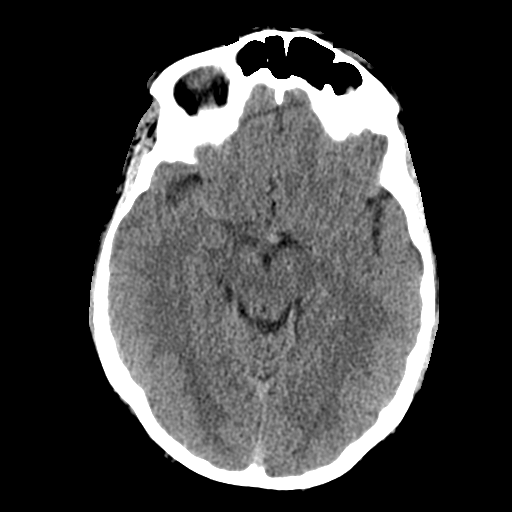
[im 16/31  brain]
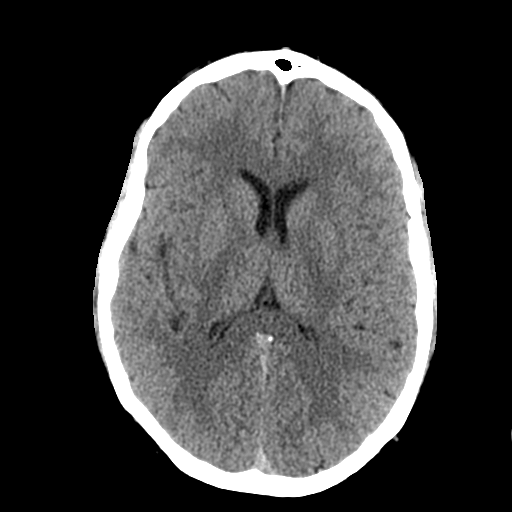
[im 19/31  brain]
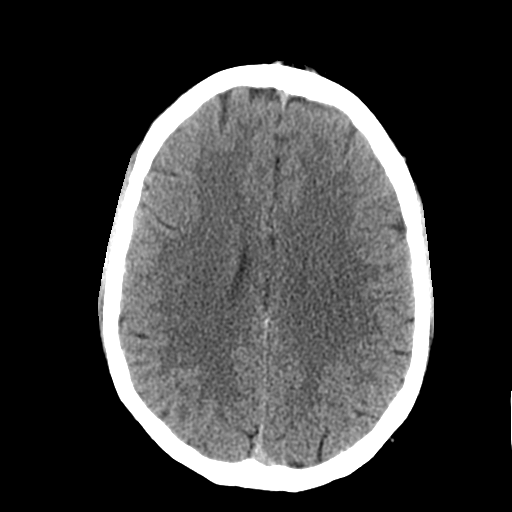
[im 19/31  bone]
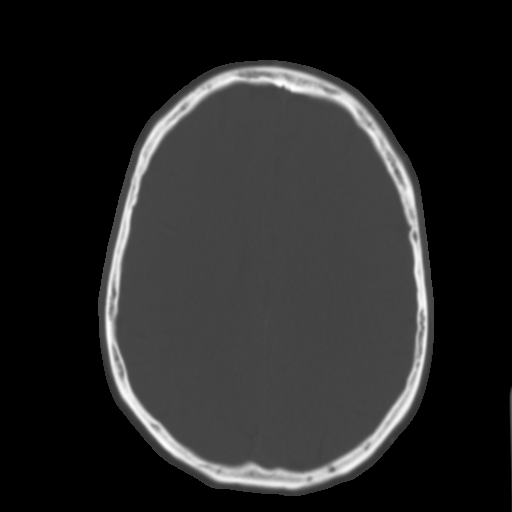
[im 23/31  brain]
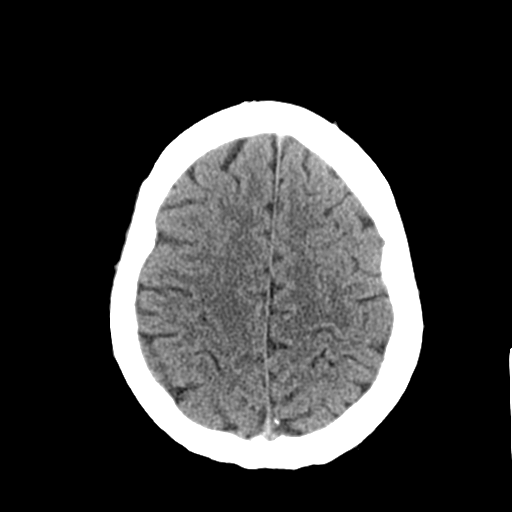
[im 27/31  brain]
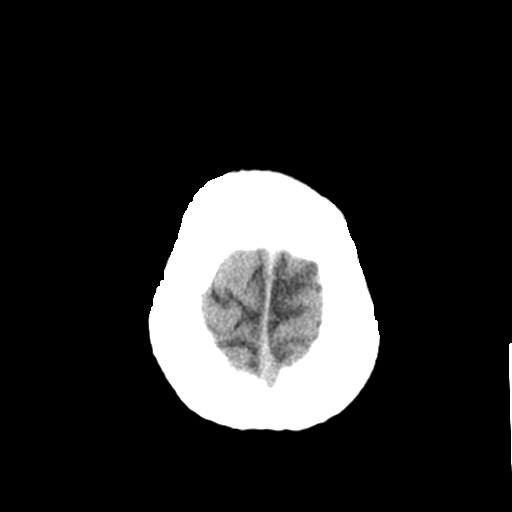

[Series 3: head bone · axial · 0.42mm/px · z∈[+27,+59]mm · 3 of 78 slices shown]
[im 8/78  bone]
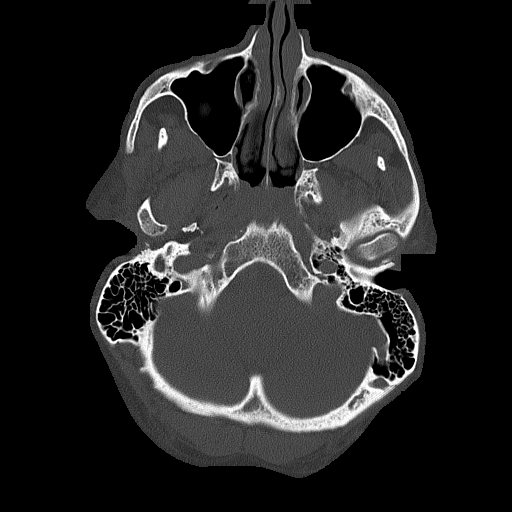
[im 16/78  bone]
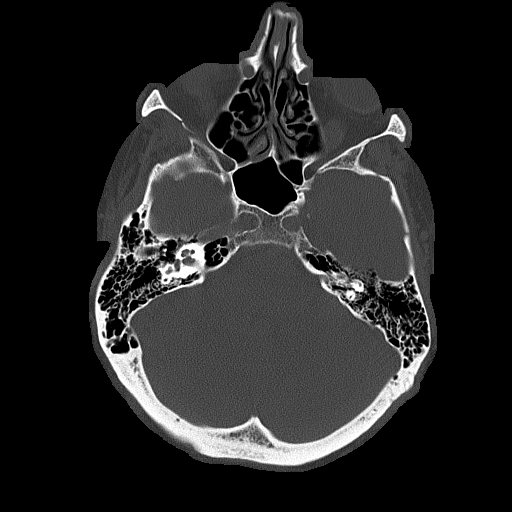
[im 24/78  bone]
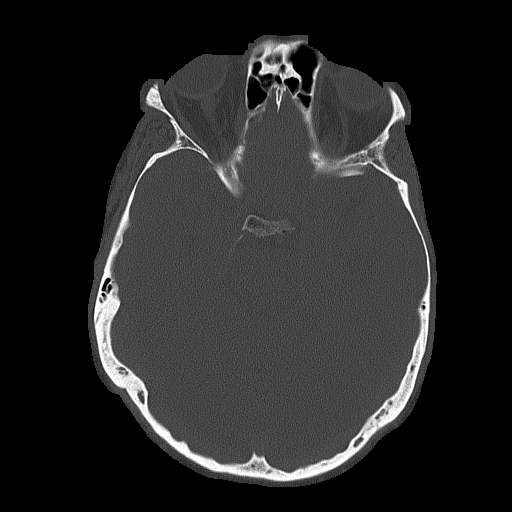

[Series 4: coronal soft tissue · coronal · 0.32mm/px · 3 of 72 slices shown]
[im 24/72  brain]
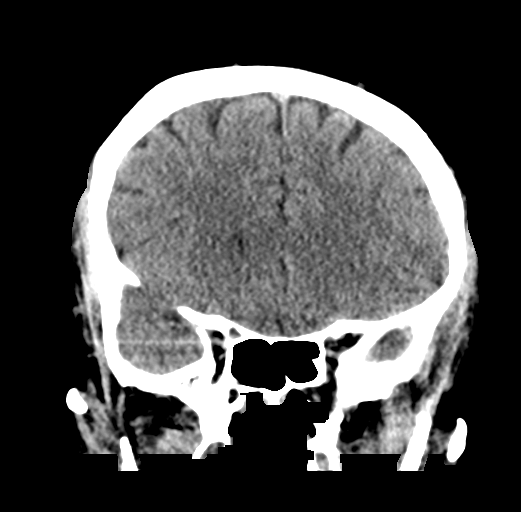
[im 32/72  brain]
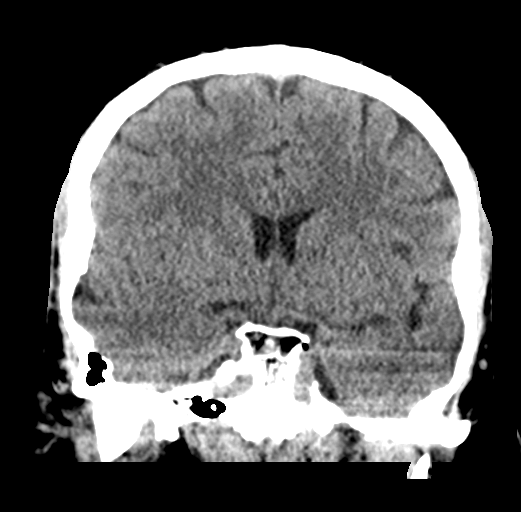
[im 40/72  brain]
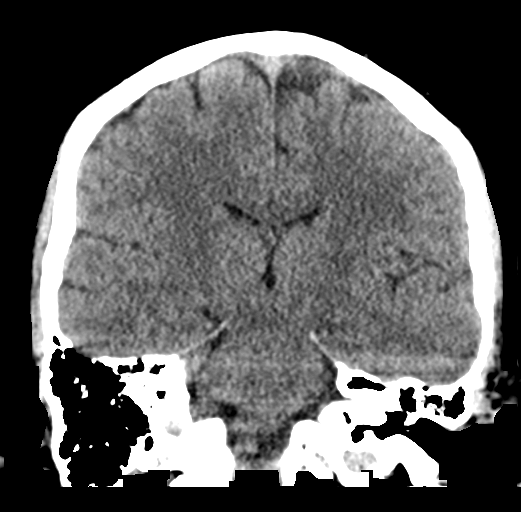

[Series 5: sagittal soft tissue · sagittal · 0.35mm/px · 3 of 59 slices shown]
[im 20/59  brain]
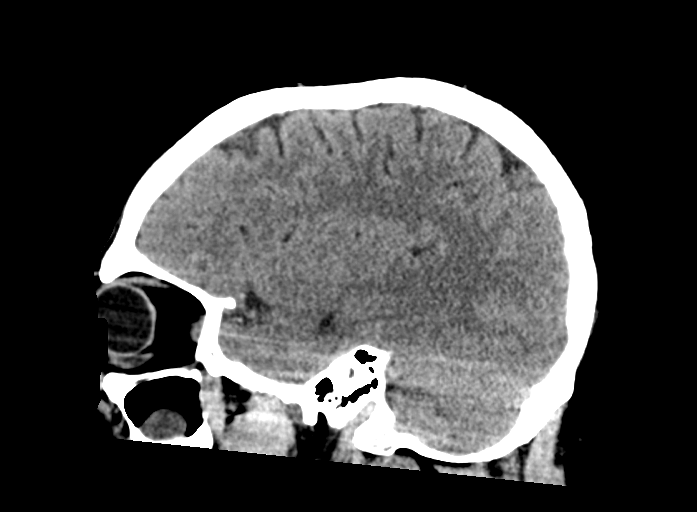
[im 30/59  brain]
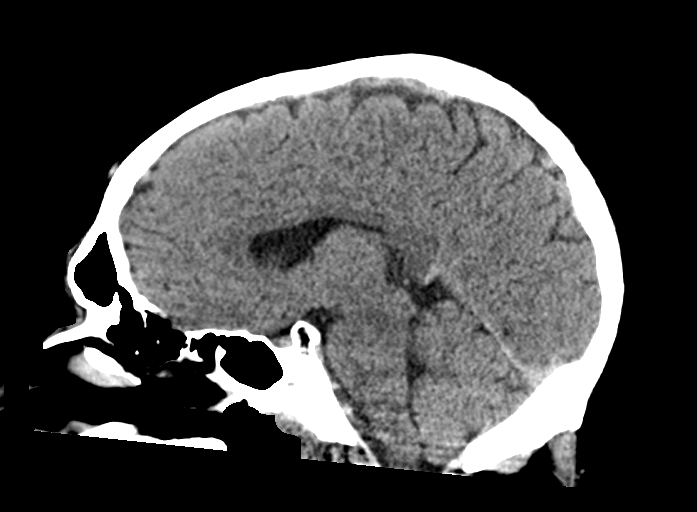
[im 39/59  brain]
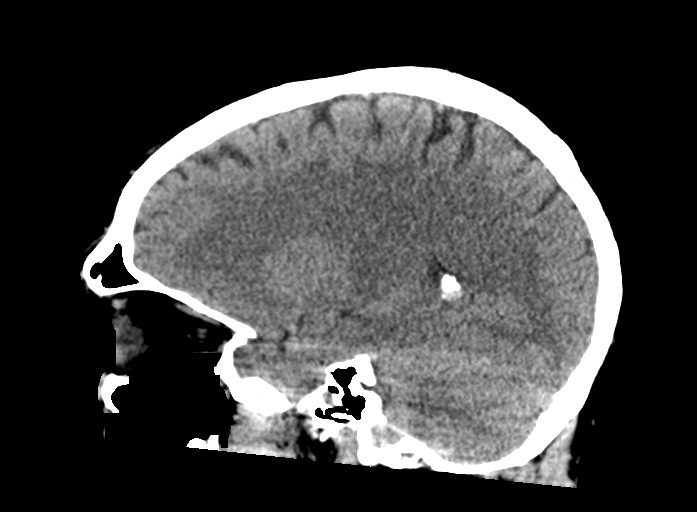

[16 of 47 positions shown; findings below may reference images not displayed]

FINDINGS: Brain: No evidence of acute infarction, hemorrhage, hydrocephalus,
extra-axial collection or mass lesion/mass effect.

Vascular: No hyperdense vessel or unexpected calcification.

Skull: No osseous abnormality.

Sinuses/Orbits: Right maxillary sinus mucous retention cyst.
Remainder the paranasal sinuses are clear. Visualized mastoid
sinuses are clear. Visualized orbits demonstrate no focal
abnormality.

Other: None
IMPRESSION: 1. No acute intracranial pathology.
2. Right maxillary sinus mucous retention cyst.

## 2021-06-27 IMAGING — DX DG CHEST 1V PORT
1 series · 1 of 1 positions shown · non-contrast
Comparison: None.

CLINICAL DATA: Chest pain and fever.  Cough.

EXAM:
PORTABLE CHEST 1 VIEW

[chest ap]
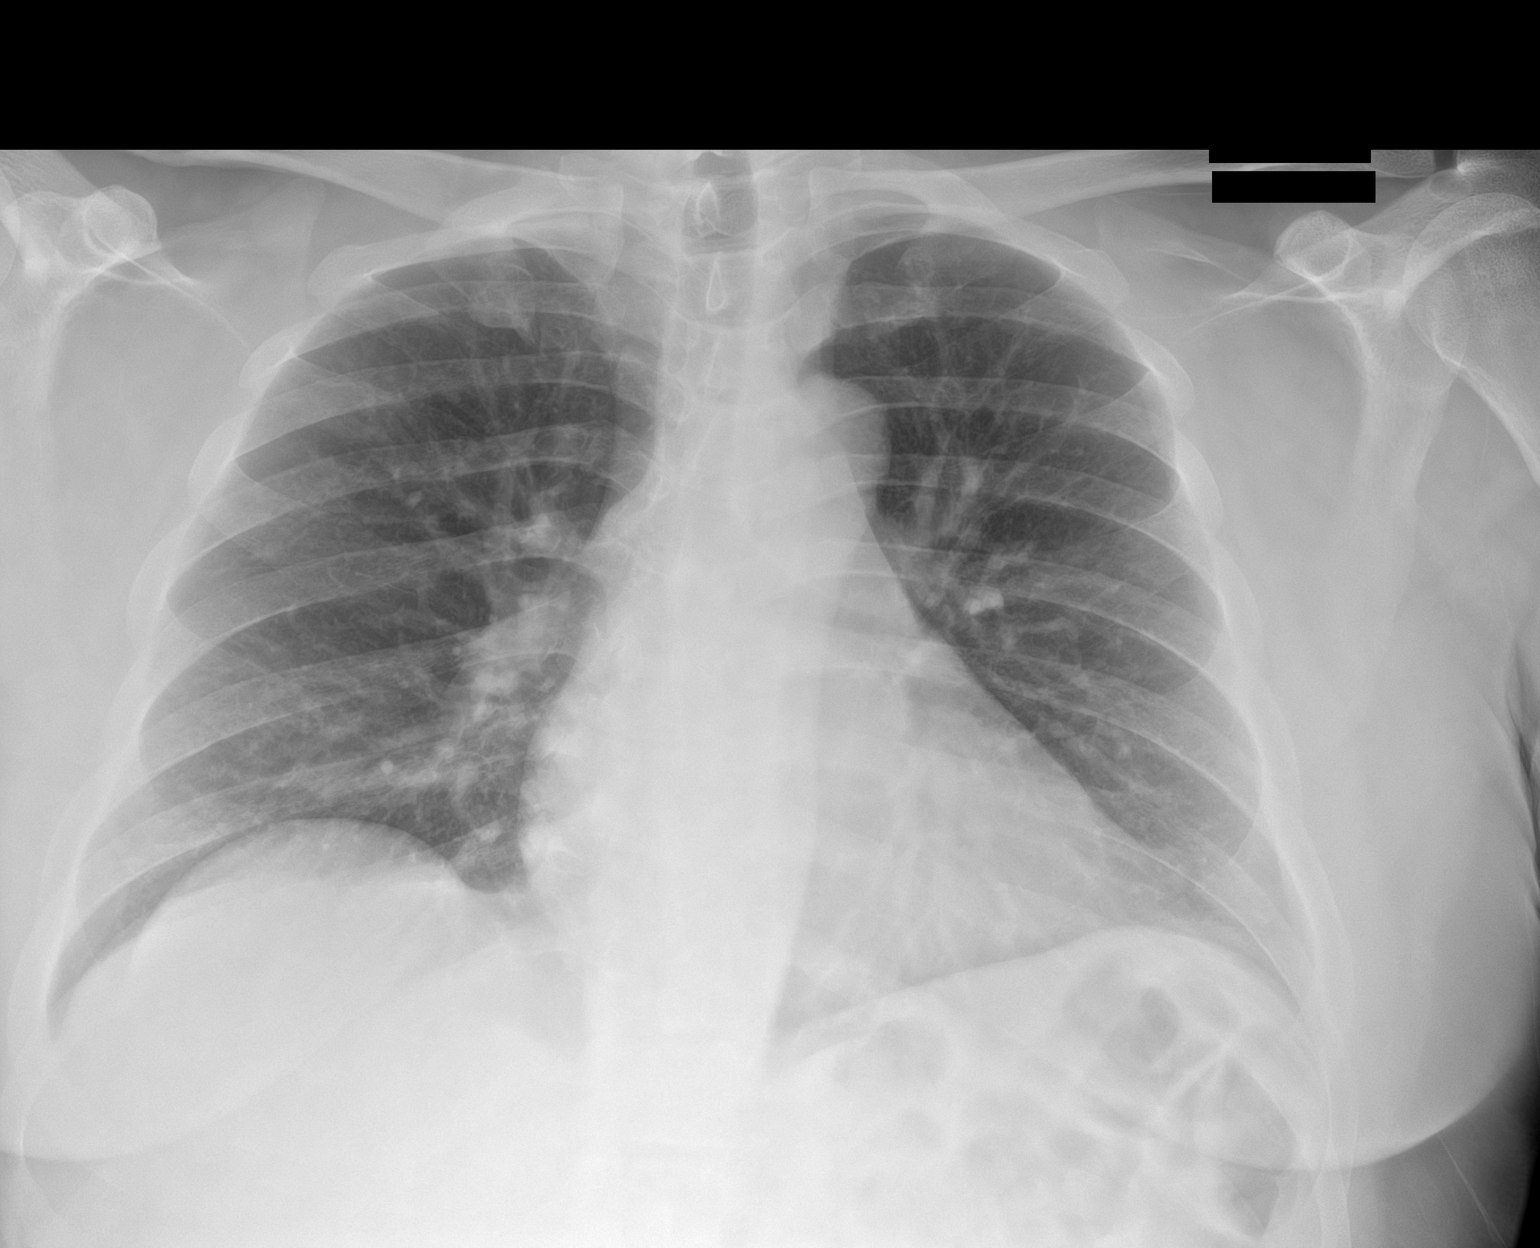

[1 of 1 positions shown; findings below may reference images not displayed]

FINDINGS: Lungs are clear. Heart size and pulmonary vascularity are normal. No
adenopathy. No pneumothorax. No bone lesions.
IMPRESSION: No edema or consolidation.

## 2022-01-10 ENCOUNTER — Inpatient Hospital Stay
Admission: RE | Admit: 2022-01-10 | Discharge: 2022-01-10 | Disposition: A | Discharge status: Home / Self Care | Payer: Medicaid Other | Source: Ambulatory Visit | Attending: Internal Medicine | Admitting: Internal Medicine

## 2022-01-12 ENCOUNTER — Ambulatory Visit: Payer: Self-pay

## 2022-01-13 ENCOUNTER — Encounter (HOSPITAL_COMMUNITY): Payer: Self-pay | Admitting: *Deleted

## 2022-01-13 ENCOUNTER — Emergency Department (HOSPITAL_COMMUNITY)
Admission: EM | Admit: 2022-01-13 | Discharge: 2022-01-13 | Disposition: A | Discharge status: Home / Self Care | Payer: Medicaid Other | Attending: Emergency Medicine | Admitting: Emergency Medicine

## 2022-01-13 ENCOUNTER — Other Ambulatory Visit: Payer: Self-pay

## 2022-01-13 DIAGNOSIS — H1032 Unspecified acute conjunctivitis, left eye: Secondary | ICD-10-CM | POA: Insufficient documentation

## 2022-01-13 MED ORDER — POLYMYXIN B-TRIMETHOPRIM 10000-0.1 UNIT/ML-% OP SOLN: 1.0000 [drp] | OPHTHALMIC | 0 refills | Status: DC

## 2022-01-13 NOTE — ED Triage Notes (Signed)
Pt c/o redness, irritation and drainage to left eye x one day

## 2022-01-13 NOTE — ED Provider Notes (Signed)
[facility] EMERGENCY DEPARTMENT Provider Note   CSN: [id number] Arrival date & time: 01/13/22  1158     History  Chief Complaint  Patient presents with   Conjunctivitis    Rodney Valdez is a 41 y.o. male.  41 y.o. male with burning, redness, discharge and mattering in left eye for 1 days.  No other symptoms.  No significant prior ophthalmological history. No change in visual acuity, no photophobia, no severe eye pain.     Conjunctivitis This is a new problem. The current episode started yesterday. The problem occurs constantly. The problem has not changed since onset.Pertinent negatives include no chest pain, no abdominal pain, no headaches and no shortness of breath.      Home Medications Prior to Admission medications   Medication Sig Start Date End Date Taking? Authorizing Provider  trimethoprim-polymyxin b (POLYTRIM) ophthalmic solution Place 1 drop into the right eye every 4 (four) hours. 01/13/22  Yes Rodney Mail, PA-C      Allergies    Patient has no known allergies.    Review of Systems   Review of Systems  Respiratory:  Negative for shortness of breath.   Cardiovascular:  Negative for chest pain.  Gastrointestinal:  Negative for abdominal pain.  Neurological:  Negative for headaches.   Physical Exam Updated Vital Signs BP (!) 155/116 (BP Location: Right Arm)   Pulse 90   Temp 98.5 F (36.9 C) (Oral)   Resp 18   Ht '5\' 7"'$  (1.702 m)   Wt 99.8 kg   SpO2 99%   BMI 34.46 kg/m  Physical Exam Vitals and nursing note reviewed.  Constitutional:      General: He is not in acute distress.    Appearance: He is well-developed. He is not diaphoretic.  HENT:     Head: Normocephalic and atraumatic.  Eyes:     General: No scleral icterus.    Conjunctiva/sclera:     Left eye: Left conjunctiva is injected. No chemosis, exudate or hemorrhage.    Comments: Left eye with erythema and watering  Cardiovascular:     Rate and Rhythm: Normal rate and regular  rhythm.     Heart sounds: Normal heart sounds.  Pulmonary:     Effort: Pulmonary effort is normal. No respiratory distress.     Breath sounds: Normal breath sounds.  Abdominal:     Palpations: Abdomen is soft.     Tenderness: There is no abdominal tenderness.  Musculoskeletal:     Cervical back: Normal range of motion and neck supple.  Skin:    General: Skin is warm and dry.  Neurological:     Mental Status: He is alert.  Psychiatric:        Behavior: Behavior normal.    ED Results / Procedures / Treatments   Labs (all labs ordered are listed, but only abnormal results are displayed) Labs Reviewed - No data to display  EKG None  Radiology No results found.  Procedures Procedures    Medications Ordered in ED Medications - No data to display  ED Course/ Medical Decision Making/ A&P                           Medical Decision Making Exam consistent with conjunctivitis No suspicion for corneal abrasion,/ulceration, no traumatic eye injuries, no exposure to chemicals or other substances, no severe eye pain or vision changes and had no concern for acute angle-closure glaucoma.  Patient will be discharged with  antibiotic drops per order. Hygiene discussed.  Follow-up with ophthalmology if symptoms not improving or worsen  Risk Prescription drug management.   Final Clinical Impression(s) / ED Diagnoses Final diagnoses:  Acute conjunctivitis of left eye, unspecified acute conjunctivitis type    Rx / DC Orders ED Discharge Orders          Ordered    trimethoprim-polymyxin b (POLYTRIM) ophthalmic solution  Every 4 hours        01/13/22 1308              Rodney Mail, PA-C 01/14/22 1307    Godfrey Pick, MD 01/18/22 1020

## 2022-01-13 NOTE — Discharge Instructions (Addendum)
Contact a health care provider if: You have a fever. Your symptoms do not get better after 10 days. Get help right away if: You have a fever and your symptoms suddenly get worse. You have severe pain when you move your eye. You have facial pain, redness, or swelling. You have a sudden loss of vision.

## 2024-01-26 ENCOUNTER — Other Ambulatory Visit: Payer: Self-pay

## 2024-01-26 ENCOUNTER — Emergency Department (HOSPITAL_COMMUNITY): Payer: Self-pay

## 2024-01-26 ENCOUNTER — Emergency Department (HOSPITAL_COMMUNITY)
Admission: EM | Admit: 2024-01-26 | Discharge: 2024-01-26 | Disposition: A | Discharge status: Home / Self Care | Payer: Self-pay | Attending: Student | Admitting: Student

## 2024-01-26 ENCOUNTER — Encounter (HOSPITAL_COMMUNITY): Payer: Self-pay

## 2024-01-26 DIAGNOSIS — K469 Unspecified abdominal hernia without obstruction or gangrene: Secondary | ICD-10-CM | POA: Insufficient documentation

## 2024-01-26 DIAGNOSIS — I10 Essential (primary) hypertension: Secondary | ICD-10-CM | POA: Insufficient documentation

## 2024-01-26 DIAGNOSIS — Z87891 Personal history of nicotine dependence: Secondary | ICD-10-CM | POA: Insufficient documentation

## 2024-01-26 LAB — CBC WITH DIFFERENTIAL/PLATELET
Abs Immature Granulocytes: 0.01 10*3/uL (ref 0.00–0.07)
Basophils Absolute: 0.1 10*3/uL (ref 0.0–0.1)
Basophils Relative: 1 %
Eosinophils Absolute: 0.2 10*3/uL (ref 0.0–0.5)
Eosinophils Relative: 2 %
HCT: 47.8 % (ref 39.0–52.0)
Hemoglobin: 16.3 g/dL (ref 13.0–17.0)
Immature Granulocytes: 0 %
Lymphocytes Relative: 20 %
Lymphs Abs: 1.4 10*3/uL (ref 0.7–4.0)
MCH: 29 pg (ref 26.0–34.0)
MCHC: 34.1 g/dL (ref 30.0–36.0)
MCV: 84.9 fL (ref 80.0–100.0)
Monocytes Absolute: 0.6 10*3/uL (ref 0.1–1.0)
Monocytes Relative: 9 %
Neutro Abs: 5 10*3/uL (ref 1.7–7.7)
Neutrophils Relative %: 68 %
Platelets: 288 10*3/uL (ref 150–400)
RBC: 5.63 MIL/uL (ref 4.22–5.81)
RDW: 13.5 % (ref 11.5–15.5)
WBC: 7.3 10*3/uL (ref 4.0–10.5)
nRBC: 0 % (ref 0.0–0.2)

## 2024-01-26 LAB — COMPREHENSIVE METABOLIC PANEL WITH GFR
ALT: 21 U/L (ref 0–44)
AST: 27 U/L (ref 15–41)
Albumin: 4.5 g/dL (ref 3.5–5.0)
Alkaline Phosphatase: 55 U/L (ref 38–126)
Anion gap: 9 (ref 5–15)
BUN: 17 mg/dL (ref 6–20)
CO2: 25 mmol/L (ref 22–32)
Calcium: 9.4 mg/dL (ref 8.9–10.3)
Chloride: 103 mmol/L (ref 98–111)
Creatinine, Ser: 1 mg/dL (ref 0.61–1.24)
GFR, Estimated: >60 mL/min (ref >60)
Glucose, Bld: 108 mg/dL — ABNORMAL HIGH (ref 70–99)
Potassium: 3.9 mmol/L (ref 3.5–5.1)
Sodium: 137 mmol/L (ref 135–145)
Total Bilirubin: 1 mg/dL (ref 0.0–1.2)
Total Protein: 8.5 g/dL — ABNORMAL HIGH (ref 6.5–8.1)

## 2024-01-26 MED ORDER — IOHEXOL 300 MG/ML  SOLN
100.0000 mL | Freq: Once | INTRAMUSCULAR | Status: AC | PRN
  Administered 2024-01-26: 100 mL via INTRAVENOUS

## 2024-01-26 MED ORDER — MORPHINE SULFATE (PF) 4 MG/ML IV SOLN
4.0000 mg | Freq: Once | INTRAVENOUS | Status: AC
  Administered 2024-01-26: 4 mg via INTRAVENOUS
  Filled 2024-01-26: qty 1

## 2024-01-26 MED ORDER — ONDANSETRON HCL 4 MG/2ML IJ SOLN
4.0000 mg | Freq: Once | INTRAMUSCULAR | Status: AC
  Administered 2024-01-26: 4 mg via INTRAVENOUS
  Filled 2024-01-26: qty 2

## 2024-01-26 NOTE — ED Notes (Signed)
 Patient transported to CT

## 2024-01-26 NOTE — ED Provider Notes (Signed)
 [place] EMERGENCY DEPARTMENT AT [facility] Provider Note  CSN: [id number] Arrival date & time: 01/26/24 1812  Chief Complaint(s) Hernia  HPI Rodney Valdez is a 43 y.o. male with PMH 2 previous hernia repairs, known paramedical hernia who presents emergency room for evaluation of abdominal pain in setting of an abdominal hernia.  Patient states that now and a half prior to arrival he had sudden worsening of the pain around his hernia site with associated nausea.  Denies vomiting, chest pain, shortness of breath, headache, fever or other systemic symptoms.   Past Medical History Past Medical History:  Diagnosis Date   Hypertension    Migraine    Varicose veins of both lower extremities    SUPERFICIAL CLOTS?   Patient Active Problem List   Diagnosis Date Noted   Gastritis and gastroduodenitis 04/18/2014   GI bleed 04/16/2014   Melena 04/16/2014   Abdominal pain, other specified site 04/16/2014   Acute blood loss anemia 04/16/2014   Hypertension    Home Medication(s) Prior to Admission medications   Medication Sig Start Date End Date Taking? Authorizing Provider  trimethoprim -polymyxin b  (POLYTRIM ) ophthalmic solution Place 1 drop into the right eye every 4 (four) hours. 01/13/22   Tama Fails, PA-C                                                                                                                                    Past Surgical History Past Surgical History:  Procedure Laterality Date   APPENDECTOMY     COLONOSCOPY WITH PROPOFOL  N/A 04/18/2014   Procedure: COLONOSCOPY WITH PROPOFOL ;  Surgeon: Alyce Jubilee, MD;  Location: [facility];  Service: Endoscopy;  Laterality: N/A;  Cecum time in 1320   time out 1338 ,  total time 18 minutes   ENTEROSCOPY N/A 04/18/2014   Procedure:  ENTEROSCOPY WITH BIOPSYS;  Surgeon: Alyce Jubilee, MD;  Location: [facility];  Service: Endoscopy;  Laterality: N/A;   ESOPHAGOGASTRODUODENOSCOPY N/A 04/16/2014   Procedure:  ESOPHAGOGASTRODUODENOSCOPY (EGD);  Surgeon: Alyce Jubilee, MD;  Location: [facility];  Service: Endoscopy;  Laterality: N/A;   GIVENS CAPSULE STUDY N/A 04/16/2014   Procedure: GIVENS CAPSULE STUDY;  Surgeon: Alyce Jubilee, MD;  Location: [facility];  Service: Endoscopy;  Laterality: N/A;   HERNIA REPAIR  WB   POLYPECTOMY N/A 04/18/2014   Procedure: POLYPECTOMY;  Surgeon: Alyce Jubilee, MD;  Location: [facility];  Service: Endoscopy;  Laterality: N/A;   Family History Family History  Problem Relation Age of Onset   Colon cancer Other        NEGATIVE   Colon polyps Other        NEGATIVE   Bleeding Disorder Other        NEGATIVE    Social History Social History   Tobacco Use   Smoking status: Former    Current packs/day: 0.00    Types:  Cigarettes    Quit date: 11/25/2017    Years since quitting: 6.1   Smokeless tobacco: Never  Vaping Use   Vaping status: Never Used  Substance Use Topics   Alcohol use: No   Drug use: No   Allergies Patient has no known allergies.  Review of Systems Review of Systems  Gastrointestinal:  Positive for abdominal pain and nausea.    Physical Exam Vital Signs  I have reviewed the triage vital signs BP (!) 179/126 (BP Location: Right Arm)   Pulse 77   Temp (!) 97.5 F (36.4 C) (Temporal)   Resp 18   Ht 5\' 7"  (1.702 m)   Wt 99.8 kg   SpO2 99%   BMI 34.46 kg/m   Physical Exam Vitals and nursing note reviewed.  Constitutional:      General: He is not in acute distress.    Appearance: He is well-developed.  HENT:     Head: Normocephalic and atraumatic.  Eyes:     Conjunctiva/sclera: Conjunctivae normal.  Cardiovascular:     Rate and Rhythm: Normal rate and regular rhythm.     Heart sounds: No murmur heard. Pulmonary:     Effort: Pulmonary effort is normal. No respiratory distress.     Breath sounds: Normal breath sounds.  Abdominal:     Palpations: Abdomen is soft.     Tenderness: There is abdominal tenderness.     Hernia:  A hernia is present.  Musculoskeletal:        General: No swelling.     Cervical back: Neck supple.  Skin:    General: Skin is warm and dry.     Capillary Refill: Capillary refill takes less than 2 seconds.  Neurological:     Mental Status: He is alert.  Psychiatric:        Mood and Affect: Mood normal.     ED Results and Treatments Labs (all labs ordered are listed, but only abnormal results are displayed) Labs Reviewed  COMPREHENSIVE METABOLIC PANEL WITH GFR  CBC WITH DIFFERENTIAL/PLATELET                                                                                                                          Radiology No results found.  Pertinent labs & imaging results that were available during my care of the patient were reviewed by me and considered in my medical decision making (see MDM for details).  Medications Ordered in ED Medications  morphine  (PF) 4 MG/ML injection 4 mg (has no administration in time range)  ondansetron  (ZOFRAN ) injection 4 mg (has no administration in time range)  Procedures Procedures  (including critical care time)  Medical Decision Making / ED Course   This patient presents to the ED for concern of ***, this involves an extensive number of treatment options, and is a complaint that carries with it a high risk of complications and morbidity.  The differential diagnosis includes ***  MDM: ***   Additional history obtained: -Additional history obtained from *** -External records from outside source obtained and reviewed including: Chart review including previous notes, labs, imaging, consultation notes   Lab Tests: -I ordered, reviewed, and interpreted labs.   The pertinent results include:   Labs Reviewed  COMPREHENSIVE METABOLIC PANEL WITH GFR  CBC WITH DIFFERENTIAL/PLATELET      EKG ***  EKG  Interpretation Date/Time:    Ventricular Rate:    PR Interval:    QRS Duration:    QT Interval:    QTC Calculation:   R Axis:      Text Interpretation:           Imaging Studies ordered: I ordered imaging studies including *** I independently visualized and interpreted imaging. I agree with the radiologist interpretation   Medicines ordered and prescription drug management: Meds ordered this encounter  Medications   morphine  (PF) 4 MG/ML injection 4 mg    Refill:  0   ondansetron  (ZOFRAN ) injection 4 mg    -I have reviewed the patients home medicines and have made adjustments as needed  Critical interventions ***  Consultations Obtained: I requested consultation with the ***,  and discussed lab and imaging findings as well as pertinent plan - they recommend: ***   Cardiac Monitoring: The patient was maintained on a cardiac monitor.  I personally viewed and interpreted the cardiac monitored which showed an underlying rhythm of: ***  Social Determinants of Health:  Factors impacting patients care include: ***   Reevaluation: After the interventions noted above, I reevaluated the patient and found that they have :{resolved/improved/worsened:23923::"improved"}  Co morbidities that complicate the patient evaluation  Past Medical History:  Diagnosis Date   Hypertension    Migraine    Varicose veins of both lower extremities    SUPERFICIAL CLOTS?      Dispostion: I considered admission for this patient, ***     Final Clinical Impression(s) / ED Diagnoses Final diagnoses:  None     @PCDICTATION @

## 2024-01-26 NOTE — ED Triage Notes (Signed)
 Pt arrived via POV c/o left quadrant hernia protruding from abdominal cavity. Pt reports this is his 3rd hernia, and has had previous repair surgeries. Pt reports he has been unsuccessful at pushing this one back in.

## 2024-01-26 NOTE — ED Notes (Signed)
 ED Provider at bedside.

## 2024-03-28 NOTE — ED Provider Notes (Signed)
 Emergency Department Provider Note    ED Clinical Impression   Final diagnoses:  Abdominal wall hernia (Primary)    ED Assessment/Plan    Condition: Stable Disposition: Discharge  This chart has been completed using Dragon Medical Dictation software, and while attempts have been made to ensure accuracy, certain words and phrases may not be transcribed as intended.   History  No chief complaint on file.  HPI  Rodney Valdez is a 43 y.o. male  who presents today to the  emergency department complaining of abdominal wall hernia.  Patient states has had this for some time now.  Patient states that he just typically reduces it himself but now it has been popping out more often.  He has prior history of 2 previous hernia repairs.  He does have some slight pain.  He denies any vomiting or diarrhea.      Allergies: has no known allergies. Medications: is not on any long-term medications. PMHx:  has no past medical history on file. PSHx:  has no past surgical history on file. SocHx:  reports that he has never smoked. He has never used smokeless tobacco. He reports that he does not drink alcohol and does not use drugs. Allergies, Medications, Medical, Surgical, and Social History were reviewed as documented above.   Social Drivers of Health with Concerns   Alcohol Use: Not on file  Physical Activity: Inactive (03/28/2024)   Exercise Vital Sign   . Days of Exercise per Week: 0 days   . Minutes of Exercise per Session: 0 min  Social Connections: Not on file  Internet Connectivity: Internet connectivity concern unknown (03/28/2024)   Internet Connectivity   . Do you have access to internet services: Yes   . How do you connect to the internet: Not on file   . Is your internet connection strong enough for you to watch video on your device without major problems?: Not on file   . Do you have enough data to  get through the month?: Not on file   . Does at least one of the devices have a camera that you can use for video chat?: Not on file     Review Of Systems  Review of Systems  Constitutional:  Negative for fever.  HENT:  Negative for congestion.   Respiratory:  Negative for chest tightness and shortness of breath.   Cardiovascular:  Negative for chest pain.  Gastrointestinal:  Positive for abdominal pain.  Skin:  Negative for color change.  Psychiatric/Behavioral:  Negative for behavioral problems.   All other systems reviewed and are negative.   Physical Exam   BP 156/90   Pulse 66   Temp 36.6 C (97.8 F) (Temporal)   Resp 18   Ht 170.2 cm (5' 7)   Wt (!) 105.1 kg (231 lb 9.6 oz)   SpO2 99%   BMI 36.27 kg/m   Physical Exam Vitals and nursing note reviewed.  Constitutional:      General: He is not in acute distress. HENT:     Head: Normocephalic.  Eyes:     Conjunctiva/sclera: Conjunctivae normal.  Cardiovascular:     Rate and Rhythm: Regular rhythm.     Pulses: Normal pulses.     Heart sounds: Normal heart sounds.  Pulmonary:     Effort:  No respiratory distress.     Breath sounds: Normal breath sounds.  Abdominal:     General: There is no distension.     Hernia: A hernia is present.     Comments: Patient has a very easily reducible left-sided fracture hernia just slightly distal to the umbilicus.  No evidence of incarceration.  No tenderness.  Musculoskeletal:        General: No deformity.  Skin:    General: Skin is warm.     Capillary Refill: Capillary refill takes 2 to 3 seconds.     Comments: Normal cap refill.  Neurological:     General: No focal deficit present.  Psychiatric:        Mood and Affect: Mood normal.     ED Course  Medical Decision Making Patient has a reducible ventral wall hernia.  Patient will need surgical management electively.  No evidence of incarceration strangulation.  Will refer patient to surgery.  Patient counseled to  reduce any heavy lifting.  He works as a production designer, theatre/television/film at [employer].  The patient is stable for discharge.  He is agreeable to plan.  Referral sent. Hernia reduced.  I have reviewed my clinical findings and my clinical impression with the patient. The patient has expressed understanding that at this time there is no evidence for a more malignant underlying process, but the patient also understands that early in the process of a condition such as this, an initial workup can be falsely reassuring. I have counseled the patient and discussed follow-up with the patient, stressing the importance of appropriate follow-up. I have also counseled the patient to return if worse or any concerns. Routine discharge counseling was given to the patient and the patient understands that worsening, changing or persistent symptoms should prompt an immediate call or follow up with their primary physician or return to the emergency department for reevaluation. Patient has expressed understanding.        Procedures   No results found for this visit on 03/28/24 (from the past 4464 hours).   ED Results No results found for any visits on 03/28/24. No results found.  Medications Administered:  Medications  traMADol (ULTRAM) tablet 50 mg (50 mg Oral Given 03/28/24 9081)    Discharge Medications (Medications Prescribed during this  ED visit and Patient's Home Medications) :    Your Medication List     START taking these medications    traMADol 50 mg tablet Commonly known as: ULTRAM Take 1 tablet (50 mg total) by mouth every six (6) hours as needed for pain for up to 5 days.          Cherie Ardeen Hanger, MD 03/28/24 [phone]

## 2024-03-28 NOTE — ED Triage Notes (Signed)
 History of hernia to lower abdomen popping out.  Pt says he normally pushes it back in but not able to d/t to pain.  This has been going on for 10 months to one year. Rates pain 6/10.

## 2024-07-25 ENCOUNTER — Emergency Department (HOSPITAL_COMMUNITY)

## 2024-07-25 ENCOUNTER — Other Ambulatory Visit: Payer: Self-pay

## 2024-07-25 ENCOUNTER — Encounter (HOSPITAL_COMMUNITY): Payer: Self-pay

## 2024-07-25 ENCOUNTER — Observation Stay (HOSPITAL_COMMUNITY)
Admission: EM | Admit: 2024-07-25 | Discharge: 2024-07-28 | DRG: 355 | Disposition: A | Attending: General Surgery | Admitting: General Surgery

## 2024-07-25 DIAGNOSIS — R1033 Periumbilical pain: Secondary | ICD-10-CM

## 2024-07-25 DIAGNOSIS — K429 Umbilical hernia without obstruction or gangrene: Principal | ICD-10-CM

## 2024-07-25 DIAGNOSIS — K432 Incisional hernia without obstruction or gangrene: Secondary | ICD-10-CM | POA: Diagnosis not present

## 2024-07-25 LAB — CBC WITH DIFFERENTIAL/PLATELET
Abs Immature Granulocytes: 0.02 K/uL (ref 0.00–0.07)
Basophils Absolute: 0.1 K/uL (ref 0.0–0.1)
Basophils Relative: 1 %
Eosinophils Absolute: 0.1 K/uL (ref 0.0–0.5)
Eosinophils Relative: 1 %
HCT: 48 % (ref 39.0–52.0)
Hemoglobin: 16.3 g/dL (ref 13.0–17.0)
Immature Granulocytes: 0 %
Lymphocytes Relative: 13 %
Lymphs Abs: 1.2 K/uL (ref 0.7–4.0)
MCH: 28.8 pg (ref 26.0–34.0)
MCHC: 34 g/dL (ref 30.0–36.0)
MCV: 84.8 fL (ref 80.0–100.0)
Monocytes Absolute: 0.8 K/uL (ref 0.1–1.0)
Monocytes Relative: 9 %
Neutro Abs: 7 K/uL (ref 1.7–7.7)
Neutrophils Relative %: 76 %
Platelets: 255 K/uL (ref 150–400)
RBC: 5.66 MIL/uL (ref 4.22–5.81)
RDW: 13.6 % (ref 11.5–15.5)
WBC: 9.2 K/uL (ref 4.0–10.5)
nRBC: 0 % (ref 0.0–0.2)

## 2024-07-25 LAB — COMPREHENSIVE METABOLIC PANEL WITH GFR
ALT: 7 U/L (ref 0–44)
AST: 18 U/L (ref 15–41)
Albumin: 4.6 g/dL (ref 3.5–5.0)
Alkaline Phosphatase: 75 U/L (ref 38–126)
Anion gap: 12 (ref 5–15)
BUN: 13 mg/dL (ref 6–20)
CO2: 25 mmol/L (ref 22–32)
Calcium: 9.7 mg/dL (ref 8.9–10.3)
Chloride: 99 mmol/L (ref 98–111)
Creatinine, Ser: 0.83 mg/dL (ref 0.61–1.24)
GFR, Estimated: >60 mL/min (ref >60)
Glucose, Bld: 131 mg/dL — ABNORMAL HIGH (ref 70–99)
Potassium: 3.9 mmol/L (ref 3.5–5.1)
Sodium: 137 mmol/L (ref 135–145)
Total Bilirubin: 0.9 mg/dL (ref 0.0–1.2)
Total Protein: 8 g/dL (ref 6.5–8.1)

## 2024-07-25 LAB — LACTIC ACID, PLASMA
Lactic Acid, Venous: 1.5 mmol/L (ref 0.5–1.9)
Lactic Acid, Venous: 0.8 mmol/L (ref 0.5–1.9)

## 2024-07-25 LAB — LIPASE, BLOOD: Lipase: 21 U/L (ref 11–51)

## 2024-07-25 MED ORDER — CHLORHEXIDINE GLUCONATE CLOTH 2 % EX PADS
6.0000 | MEDICATED_PAD | Freq: Once | CUTANEOUS | Status: AC
  Administered 2024-07-26: 6 via TOPICAL

## 2024-07-25 MED ORDER — IOHEXOL 300 MG/ML  SOLN
100.0000 mL | Freq: Once | INTRAMUSCULAR | Status: AC | PRN
  Administered 2024-07-25: 100 mL via INTRAVENOUS

## 2024-07-25 MED ORDER — OXYCODONE HCL 5 MG PO TABS
5.0000 mg | ORAL_TABLET | ORAL | Status: DC | PRN
  Administered 2024-07-26: 10 mg via ORAL
  Administered 2024-07-26: 5 mg via ORAL
  Administered 2024-07-27 (×2): 10 mg via ORAL
  Administered 2024-07-27: 5 mg via ORAL
  Administered 2024-07-28: 10 mg via ORAL
  Filled 2024-07-25 (×2): qty 2
  Filled 2024-07-25: qty 1
  Filled 2024-07-25 (×3): qty 2

## 2024-07-25 MED ORDER — METOPROLOL TARTRATE 5 MG/5ML IV SOLN
2.5000 mg | Freq: Four times a day (QID) | INTRAVENOUS | Status: DC
  Administered 2024-07-26 (×2): 2.5 mg via INTRAVENOUS
  Filled 2024-07-25 (×2): qty 5

## 2024-07-25 MED ORDER — ACETAMINOPHEN 650 MG RE SUPP: 650.0000 mg | Freq: Four times a day (QID) | RECTAL | Status: DC | PRN

## 2024-07-25 MED ORDER — ACETAMINOPHEN 325 MG PO TABS
650.0000 mg | ORAL_TABLET | Freq: Four times a day (QID) | ORAL | Status: DC | PRN
  Administered 2024-07-26 – 2024-07-27 (×3): 650 mg via ORAL
  Filled 2024-07-25 (×2): qty 2

## 2024-07-25 MED ORDER — SODIUM CHLORIDE 0.9 % IV SOLN: INTRAVENOUS | Status: AC

## 2024-07-25 MED ORDER — SIMETHICONE 80 MG PO CHEW: 40.0000 mg | CHEWABLE_TABLET | Freq: Four times a day (QID) | ORAL | Status: DC | PRN

## 2024-07-25 MED ORDER — ENOXAPARIN SODIUM 40 MG/0.4ML IJ SOSY
40.0000 mg | PREFILLED_SYRINGE | INTRAMUSCULAR | Status: DC
  Administered 2024-07-25 – 2024-07-26 (×2): 40 mg via SUBCUTANEOUS
  Filled 2024-07-25 (×2): qty 0.4

## 2024-07-25 MED ORDER — ONDANSETRON 4 MG PO TBDP: 4.0000 mg | ORAL_TABLET | Freq: Four times a day (QID) | ORAL | Status: DC | PRN

## 2024-07-25 MED ORDER — CEFAZOLIN SODIUM-DEXTROSE 2-4 GM/100ML-% IV SOLN
2.0000 g | INTRAVENOUS | Status: AC
  Administered 2024-07-26: 2 g via INTRAVENOUS
  Filled 2024-07-25: qty 100

## 2024-07-25 MED ORDER — ONDANSETRON HCL 4 MG/2ML IJ SOLN
4.0000 mg | Freq: Four times a day (QID) | INTRAMUSCULAR | Status: DC | PRN
  Administered 2024-07-25 – 2024-07-27 (×2): 4 mg via INTRAVENOUS
  Filled 2024-07-25 (×2): qty 2

## 2024-07-25 MED ORDER — HYDROMORPHONE HCL 1 MG/ML IJ SOLN
1.0000 mg | INTRAMUSCULAR | Status: DC | PRN
  Administered 2024-07-25 – 2024-07-26 (×6): 1 mg via INTRAVENOUS
  Filled 2024-07-25 (×6): qty 1

## 2024-07-25 MED ORDER — MORPHINE SULFATE (PF) 2 MG/ML IV SOLN
2.0000 mg | Freq: Once | INTRAVENOUS | Status: AC
  Administered 2024-07-25: 2 mg via INTRAVENOUS
  Filled 2024-07-25: qty 1

## 2024-07-25 NOTE — ED Provider Notes (Signed)
 Care of patient assumed from Dr. Jackquline.  This patient presented with abdominal pain and increased size of known abdominal wall hernia.  Hernia is irreducible.  CT scan shows early obstruction.  Dr. Mavis to see. Physical Exam  BP (!) 171/121   Pulse 75   Temp 98.4 F (36.9 C) (Oral)   Resp 17   Ht 5' 7 (1.702 m)   Wt 99.8 kg   SpO2 95%   BMI 34.46 kg/m   Physical Exam Vitals and nursing note reviewed.  Constitutional:      Appearance: He is well-developed.  HENT:     Head: Normocephalic and atraumatic.  Eyes:     Conjunctiva/sclera: Conjunctivae normal.  Cardiovascular:     Rate and Rhythm: Normal rate and regular rhythm.  Pulmonary:     Effort: Pulmonary effort is normal. No respiratory distress.  Musculoskeletal:        General: No swelling.     Cervical back: Neck supple.  Skin:    General: Skin is warm and dry.  Neurological:     Mental Status: He is alert.  Psychiatric:        Mood and Affect: Mood normal.        Behavior: Behavior normal.     Procedures  Procedures  ED Course / MDM    Medical Decision Making Amount and/or Complexity of Data Reviewed Labs: ordered. Radiology: ordered.  Risk Prescription drug management. Decision regarding hospitalization.   Patient was evaluated by Dr. Mavis.  Dr. Mavis to admit for planned operative repair tomorrow.       Melvenia Motto, MD 07/26/24 [phone]

## 2024-07-25 NOTE — H&P (Signed)
 Rodney Valdez is an 43 y.o. male.   Chief Complaint: Incarcerated ventral hernia HPI: Patient is a 43 year old white male status post umbilical herniorrhaphies x 2 in the remote past who presents with an incarcerated periumbilical hernia.  He states he was has had the hernia present for some time, but he recently started enlarging and causing him discomfort.  He presented to the emergency room and initially, the hernia cannot be reduced.  He has since been reduced but easily pops out.  The patient's pain was somewhat relieved after reduction.  He has been passing gas and is not nauseated or throwing up at the present time.  Past Medical History:  Diagnosis Date   Hypertension    Migraine    Varicose veins of both lower extremities    SUPERFICIAL CLOTS?    Past Surgical History:  Procedure Laterality Date   APPENDECTOMY     COLONOSCOPY WITH PROPOFOL  N/A 04/18/2014   Procedure: COLONOSCOPY WITH PROPOFOL ;  Surgeon: Margo LITTIE Haddock, MD;  Location: [facility];  Service: Endoscopy;  Laterality: N/A;  Cecum time in 1320   time out 1338 ,  total time 18 minutes   ENTEROSCOPY N/A 04/18/2014   Procedure:  ENTEROSCOPY WITH BIOPSYS;  Surgeon: Margo LITTIE Haddock, MD;  Location: [facility];  Service: Endoscopy;  Laterality: N/A;   ESOPHAGOGASTRODUODENOSCOPY N/A 04/16/2014   Procedure: ESOPHAGOGASTRODUODENOSCOPY (EGD);  Surgeon: Margo LITTIE Haddock, MD;  Location: [facility];  Service: Endoscopy;  Laterality: N/A;   GIVENS CAPSULE STUDY N/A 04/16/2014   Procedure: GIVENS CAPSULE STUDY;  Surgeon: Margo LITTIE Haddock, MD;  Location: [facility];  Service: Endoscopy;  Laterality: N/A;   HERNIA REPAIR  WB   POLYPECTOMY N/A 04/18/2014   Procedure: POLYPECTOMY;  Surgeon: Margo LITTIE Haddock, MD;  Location: [facility];  Service: Endoscopy;  Laterality: N/A;    Family History  Problem Relation Age of Onset   Colon cancer Other        NEGATIVE   Colon polyps Other        NEGATIVE   Bleeding Disorder Other        NEGATIVE   Social  History:  reports that he quit smoking about 6 years ago. He has never used smokeless tobacco. He reports that he does not drink alcohol and does not use drugs.  Allergies: No Known Allergies  (Not in a hospital admission)   Results for orders placed or performed during the hospital encounter of 07/25/24 (from the past 48 hours)  CBC with Differential     Status: None   Collection Time: 07/25/24 11:56 AM  Result Value Ref Range   WBC 9.2 4.0 - 10.5 K/uL   RBC 5.66 4.22 - 5.81 MIL/uL   Hemoglobin 16.3 13.0 - 17.0 g/dL   HCT 51.9 60.9 - 47.9 %   MCV 84.8 80.0 - 100.0 fL   MCH 28.8 26.0 - 34.0 pg   MCHC 34.0 30.0 - 36.0 g/dL   RDW 86.3 88.4 - 84.4 %   Platelets 255 150 - 400 K/uL   nRBC 0.0 0.0 - 0.2 %   Neutrophils Relative % 76 %   Neutro Abs 7.0 1.7 - 7.7 K/uL   Lymphocytes Relative 13 %   Lymphs Abs 1.2 0.7 - 4.0 K/uL   Monocytes Relative 9 %   Monocytes Absolute 0.8 0.1 - 1.0 K/uL   Eosinophils Relative 1 %   Eosinophils Absolute 0.1 0.0 - 0.5 K/uL   Basophils Relative 1 %  Basophils Absolute 0.1 0.0 - 0.1 K/uL   Immature Granulocytes 0 %   Abs Immature Granulocytes 0.02 0.00 - 0.07 K/uL    Comment: Performed at [facility], [address] [place], [place] [address]  Comprehensive metabolic panel     Status: Abnormal   Collection Time: 07/25/24 11:56 AM  Result Value Ref Range   Sodium 137 135 - 145 mmol/L   Potassium 3.9 3.5 - 5.1 mmol/L   Chloride 99 98 - 111 mmol/L   CO2 25 22 - 32 mmol/L   Glucose, Bld 131 (H) 70 - 99 mg/dL    Comment: Glucose reference range applies only to samples taken after fasting for at least 8 hours.   BUN 13 6 - 20 mg/dL   Creatinine, Ser 9.16 0.61 - 1.24 mg/dL   Calcium 9.7 8.9 - 89.6 mg/dL   Total Protein 8.0 6.5 - 8.1 g/dL   Albumin 4.6 3.5 - 5.0 g/dL   AST 18 15 - 41 U/L   ALT 7 0 - 44 U/L   Alkaline Phosphatase 75 38 - 126 U/L   Total Bilirubin 0.9 0.0 - 1.2 mg/dL   GFR, Estimated >39 >39 mL/min    Comment: (NOTE) Calculated  using the CKD-EPI Creatinine Equation (2021)    Anion gap 12 5 - 15    Comment: Performed at [facility], [address], [place], [place] [address]  Lactic acid, plasma     Status: None   Collection Time: 07/25/24 11:56 AM  Result Value Ref Range   Lactic Acid, Venous 1.5 0.5 - 1.9 mmol/L    Comment: Performed at [facility], [address], [place], [place] [address]  Lipase, blood     Status: None   Collection Time: 07/25/24 11:56 AM  Result Value Ref Range   Lipase 21 11 - 51 U/L    Comment: Performed at [facility], [address], [place], [place] [address]  Lactic acid, plasma     Status: None   Collection Time: 07/25/24  1:31 PM  Result Value Ref Range   Lactic Acid, Venous 0.8 0.5 - 1.9 mmol/L    Comment: Performed at [facility], [address], [place], [place] [address]   CT ABDOMEN PELVIS W CONTRAST Result Date: 07/25/2024 CLINICAL DATA:  umbilical hernia. Difficult to reduce. Eval for inflammatory changes/bowel obstruction EXAM: CT ABDOMEN AND PELVIS WITH CONTRAST TECHNIQUE: Multidetector CT imaging of the abdomen and pelvis was performed using the standard protocol following bolus administration of intravenous contrast. RADIATION DOSE REDUCTION: This exam was performed according to the departmental dose-optimization program which includes automated exposure control, adjustment of the mA and/or kV according to patient size and/or use of iterative reconstruction technique. CONTRAST:  OMNIPAQUE  IOHEXOL  300 MG/ML  SOLN COMPARISON:  January 26, 2024 FINDINGS: Lower chest: No focal airspace consolidation or pleural effusion.Posterior bibasilar dependent atelectasis. Hepatobiliary: No mass.Focal fatty infiltration along the falciform ligament.Single small radiopaque gallstone. No gallbladder wall thickening. No intrahepatic or extrahepatic biliary ductal dilation. The portal veins are patent. Pancreas: No mass or main ductal dilation. No peripancreatic inflammation or fluid collection.  Spleen: Normal size. No mass. Adrenals/Urinary Tract: No adrenal masses. No renal mass. No nephrolithiasis or hydronephrosis. The urinary bladder is distended without focal abnormality. Stomach/Bowel: The stomach contains ingested material without focal abnormality. A couple of dilated segments of small bowel present in the left hemiabdomen measuring up to 3.5 cm in transverse dimension with adjacent mesenteric edema. There is a transition point along the  leftward aspect of the umbilical hernia sac (axial 53, coronal 50). A dilated segment of small bowel with small bowel feces sign is also present in the umbilical hernia sac. The small bowel exiting the hernia sac is completely decompressed.Appendectomy. Descending and sigmoid colonic diverticulosis. No changes of acute diverticulitis. Vascular/Lymphatic: No aortic aneurysm. Scattered aortoiliac atherosclerosis. No intraabdominal or pelvic lymphadenopathy. Reproductive: No prostatomegaly.Small volume free fluid in the pelvis. Other: No pneumoperitoneum.  No portal venous gas. Musculoskeletal: No acute fracture or destructive lesion. Multilevel thoracic osteophytosis. Large, multi lobular umbilical hernia again noted, measuring 8.3 by 18.4 by 10.9 cm with a 4.1 cm neck. IMPRESSION: 1. Redemonstrated, large umbilical hernia containing intra-abdominal fat and multiple segments of small bowel, as measured above. Findings consistent with a superimposed partial or early high-grade small bowel obstruction are also present. Surgical consultation recommended 2. Small volume free fluid in the pelvis, likely reactive. 3. Scattered descending and sigmoid colonic diverticulosis. No changes of acute diverticulitis. 4. Single small radiopaque gallstone. Aortic Atherosclerosis (ICD10-I70.0). Electronically Signed   By: Rogelia Myers M.D.   On: 07/25/2024 15:34    Review of Systems  Constitutional:  Positive for fatigue.  HENT: Negative.    Eyes: Negative.   Respiratory:  Negative.    Cardiovascular: Negative.   Gastrointestinal:  Positive for abdominal pain.  Endocrine: Negative.   Genitourinary: Negative.   Musculoskeletal: Negative.   Skin: Negative.   Allergic/Immunologic: Negative.   Neurological: Negative.   Hematological: Negative.   Psychiatric/Behavioral: Negative.      Blood pressure (!) 171/121, pulse 75, temperature 98.4 F (36.9 C), temperature source Oral, resp. rate 17, height 5' 7 (1.702 m), weight 99.8 kg, SpO2 95%. Physical Exam Vitals reviewed.  Constitutional:      Appearance: He is well-developed. He is not ill-appearing.  HENT:     Head: Normocephalic and atraumatic.  Cardiovascular:     Rate and Rhythm: Normal rate and regular rhythm.     Heart sounds: Normal heart sounds. No murmur heard.    No friction rub. No gallop.  Pulmonary:     Effort: Pulmonary effort is normal. No respiratory distress.     Breath sounds: Normal breath sounds. No stridor. No wheezing, rhonchi or rales.  Abdominal:     General: There is no distension.     Palpations: Abdomen is soft.     Tenderness: There is abdominal tenderness.     Hernia: A hernia is present. Hernia is present in the umbilical area and ventral area.     Comments: A large reducible periumbilical hernia present with surgical scars noted at the umbilicus.  Skin:    General: Skin is warm and dry.  Neurological:     Mental Status: He is alert and oriented to person, place, and time.     CT scan results reviewed Assessment/Plan Impression: Recurrent incarcerated ventral/umbilical hernia which has been reduced.  CT findings of a bowel obstruction noted, though patient clinically is not obstructed.  His abdomen is soft. Plan: Patient will be admitted to the hospital for IV hydration and pain control.  He subsequently will undergo a robotic assisted laparoscopic recurrent ventral herniorrhaphy with mesh, possible open tomorrow.  The risks and benefits of the procedure including  bleeding, infection, mesh use, bowel injury, recurrence of the hernia, and the possibility of an open procedure were fully explained to the patient, who gave informed consent.  Oneil Budge, MD 07/25/2024, 4:29 PM

## 2024-07-25 NOTE — ED Provider Notes (Signed)
 [facility] AT [facility] Provider Note   CSN: [id number] Arrival date & time: 07/25/24  1053     Patient presents with: Abdominal Pain   Rodney Valdez is a 43 y.o. male.    Abdominal Pain   Presents with abdominal pain.  Patient states he has a known hernia.  Increasing in size and pain over the past couple weeks.  Last bowel movement was about 2 to 3 days ago.  Still passing flatulence.  No fever no chills but just endorsing pain in this area.  Has had 2 previous hernias in the past that required repair.  No chest pain or shortness of breath.  No nausea or vomiting.  Just endorsing pain along the hernia site.  Previous medical history reviewed : Seen outside ED on March 28, 2024.  Abdominal wall hernia.  Reducible at that time.  Was seen on January 26, 2024 because of hernia as well.  Unremarkable workup.   Prior to Admission medications   Medication Sig Start Date End Date Taking? Authorizing Provider  trimethoprim -polymyxin b  (POLYTRIM ) ophthalmic solution Place 1 drop into the right eye every 4 (four) hours. 01/13/22   Harris, Abigail, PA-C    Allergies: Patient has no known allergies.    Review of Systems  Gastrointestinal:  Positive for abdominal pain.    Updated Vital Signs BP (!) 171/121   Pulse 75   Temp 98.4 F (36.9 C) (Oral)   Resp 17   Ht 5' 7 (1.702 m)   Wt 99.8 kg   SpO2 95%   BMI 34.46 kg/m   Physical Exam Vitals and nursing note reviewed.  Constitutional:      General: He is not in acute distress.    Appearance: He is well-developed.  HENT:     Head: Normocephalic and atraumatic.  Eyes:     Conjunctiva/sclera: Conjunctivae normal.  Cardiovascular:     Rate and Rhythm: Normal rate and regular rhythm.     Heart sounds: No murmur heard. Pulmonary:     Effort: Pulmonary effort is normal. No respiratory distress.     Breath sounds: Normal breath sounds.  Abdominal:     Palpations: Abdomen is soft.     Tenderness:  There is no abdominal tenderness.   Musculoskeletal:        General: No swelling.     Cervical back: Neck supple.  Skin:    General: Skin is warm and dry.     Capillary Refill: Capillary refill takes less than 2 seconds.  Neurological:     Mental Status: He is alert.  Psychiatric:        Mood and Affect: Mood normal.     (all labs ordered are listed, but only abnormal results are displayed) Labs Reviewed  COMPREHENSIVE METABOLIC PANEL WITH GFR - Abnormal; Notable for the following components:      Result Value   Glucose, Bld 131 (*)    All other components within normal limits  CBC WITH DIFFERENTIAL/PLATELET  LACTIC ACID, PLASMA  LACTIC ACID, PLASMA  LIPASE, BLOOD    EKG: None  Radiology: CT ABDOMEN PELVIS W CONTRAST Result Date: 07/25/2024 CLINICAL DATA:  umbilical hernia. Difficult to reduce. Eval for inflammatory changes/bowel obstruction EXAM: CT ABDOMEN AND PELVIS WITH CONTRAST TECHNIQUE: Multidetector CT imaging of the abdomen and pelvis was performed using the standard protocol following bolus administration of intravenous contrast. RADIATION DOSE REDUCTION: This exam was performed according to the departmental dose-optimization program which includes automated exposure  control, adjustment of the mA and/or kV according to patient size and/or use of iterative reconstruction technique. CONTRAST:  OMNIPAQUE  IOHEXOL  300 MG/ML  SOLN COMPARISON:  January 26, 2024 FINDINGS: Lower chest: No focal airspace consolidation or pleural effusion.Posterior bibasilar dependent atelectasis. Hepatobiliary: No mass.Focal fatty infiltration along the falciform ligament.Single small radiopaque gallstone. No gallbladder wall thickening. No intrahepatic or extrahepatic biliary ductal dilation. The portal veins are patent. Pancreas: No mass or main ductal dilation. No peripancreatic inflammation or fluid collection. Spleen: Normal size. No mass. Adrenals/Urinary Tract: No adrenal masses. No renal  mass. No nephrolithiasis or hydronephrosis. The urinary bladder is distended without focal abnormality. Stomach/Bowel: The stomach contains ingested material without focal abnormality. A couple of dilated segments of small bowel present in the left hemiabdomen measuring up to 3.5 cm in transverse dimension with adjacent mesenteric edema. There is a transition point along the leftward aspect of the umbilical hernia sac (axial 53, coronal 50). A dilated segment of small bowel with small bowel feces sign is also present in the umbilical hernia sac. The small bowel exiting the hernia sac is completely decompressed.Appendectomy. Descending and sigmoid colonic diverticulosis. No changes of acute diverticulitis. Vascular/Lymphatic: No aortic aneurysm. Scattered aortoiliac atherosclerosis. No intraabdominal or pelvic lymphadenopathy. Reproductive: No prostatomegaly.Small volume free fluid in the pelvis. Other: No pneumoperitoneum.  No portal venous gas. Musculoskeletal: No acute fracture or destructive lesion. Multilevel thoracic osteophytosis. Large, multi lobular umbilical hernia again noted, measuring 8.3 by 18.4 by 10.9 cm with a 4.1 cm neck. IMPRESSION: 1. Redemonstrated, large umbilical hernia containing intra-abdominal fat and multiple segments of small bowel, as measured above. Findings consistent with a superimposed partial or early high-grade small bowel obstruction are also present. Surgical consultation recommended 2. Small volume free fluid in the pelvis, likely reactive. 3. Scattered descending and sigmoid colonic diverticulosis. No changes of acute diverticulitis. 4. Single small radiopaque gallstone. Aortic Atherosclerosis (ICD10-I70.0). Electronically Signed   By: Rogelia Myers M.D.   On: 07/25/2024 15:34     Procedures   Medications Ordered in the ED  morphine  (PF) 2 MG/ML injection 2 mg (2 mg Intravenous Given 07/25/24 1157)  iohexol  (OMNIPAQUE ) 300 MG/ML solution 100 mL (100 mLs Intravenous  Contrast Given 07/25/24 1358)                                    Medical Decision Making Amount and/or Complexity of Data Reviewed Labs: ordered. Radiology: ordered.  Risk Prescription drug management.     HPI:    Presents with abdominal pain.  Patient states he has a known hernia.  Increasing in size and pain over the past couple weeks.  Last bowel movement was about 2 to 3 days ago.  Still passing flatulence.  No fever no chills but just endorsing pain in this area.  Has had 2 previous hernias in the past that required repair.  No chest pain or shortness of breath.  No nausea or vomiting.  Just endorsing pain along the hernia site.  Previous medical history reviewed : Seen outside ED on March 28, 2024.  Abdominal wall hernia.  Reducible at that time.  Was seen on January 26, 2024 because of hernia as well.  Unremarkable workup.  MDM:   On exam, patient Impeklo stable.  ANO x 3 with GCS 15.  Afebrile.  Large hernia appreciated periumbilical area left side.  Hard but no obvious skin color changes.  Tried to  reduce at bedside initially but is unable to do so.   Will obtain laboratory workup.  Will obtain lactic acid as well assessment kind of ischemic changes which I think is unlikely.  Obtain CT scan of the abdomen pelvis as well to assess for any kind of inflammatory changes around the hernia.  Reevaluation:   Upon reexamination, patient hemodynamically stable.  Remains A&O x 3 with GCS 15.   Unable to reduce the hernia at this time.  Spoke To Dr. Mavis from surgery.  He will come see the patient for further care to see what we can do for the patient whether or not he is to be taken to the OR or this can be done outpatient  Still Pending CT read.   Interventions: Morphine      I have independently interpreted the CT  images and agree with the radiologist finding   Social Determinant of Health: denies IV drug abuse    Disposition and Follow Up: Pending consult        Final diagnoses:  Umbilical hernia without obstruction and without gangrene  Periumbilical abdominal pain    ED Discharge Orders     None          Simon Lavonia SAILOR, MD 07/25/24 1549

## 2024-07-25 NOTE — ED Triage Notes (Signed)
 Pt arrived via POV from home c/o recurrent hernia's and abdominal pain. Pt reports previous hernia repair, and denies any recent injury.

## 2024-07-26 ENCOUNTER — Observation Stay (HOSPITAL_COMMUNITY)

## 2024-07-26 ENCOUNTER — Encounter (HOSPITAL_COMMUNITY): Admission: EM | Disposition: A | Payer: Self-pay | Source: Home / Self Care | Attending: General Surgery

## 2024-07-26 ENCOUNTER — Encounter (HOSPITAL_COMMUNITY): Payer: Self-pay | Admitting: General Surgery

## 2024-07-26 DIAGNOSIS — K430 Incisional hernia with obstruction, without gangrene: Secondary | ICD-10-CM | POA: Diagnosis present

## 2024-07-26 DIAGNOSIS — K432 Incisional hernia without obstruction or gangrene: Secondary | ICD-10-CM | POA: Diagnosis not present

## 2024-07-26 DIAGNOSIS — I10 Essential (primary) hypertension: Secondary | ICD-10-CM | POA: Diagnosis present

## 2024-07-26 DIAGNOSIS — Z87891 Personal history of nicotine dependence: Secondary | ICD-10-CM | POA: Diagnosis not present

## 2024-07-26 DIAGNOSIS — R1033 Periumbilical pain: Secondary | ICD-10-CM | POA: Diagnosis present

## 2024-07-26 HISTORY — PX: XI ROBOTIC ASSISTED VENTRAL HERNIA: SHX6789

## 2024-07-26 HISTORY — PX: ROBOTIC ASSISTED LAPAROSCOPIC LYSIS OF ADHESION: SHX6080

## 2024-07-26 LAB — HIV ANTIBODY (ROUTINE TESTING W REFLEX): HIV Screen 4th Generation wRfx: NONREACTIVE

## 2024-07-26 LAB — SURGICAL PCR SCREEN
MRSA, PCR: NEGATIVE
Staphylococcus aureus: NEGATIVE

## 2024-07-26 SURGERY — REPAIR, HERNIA, VENTRAL, ROBOT-ASSISTED
Anesthesia: General | Site: Abdomen

## 2024-07-26 MED ORDER — KETOROLAC TROMETHAMINE 30 MG/ML IJ SOLN
INTRAMUSCULAR | Status: AC
  Filled 2024-07-26: qty 1

## 2024-07-26 MED ORDER — PROPOFOL 10 MG/ML IV BOLUS
INTRAVENOUS | Status: AC
  Filled 2024-07-26: qty 20

## 2024-07-26 MED ORDER — ROCURONIUM BROMIDE 10 MG/ML (PF) SYRINGE
PREFILLED_SYRINGE | INTRAVENOUS | Status: AC
  Filled 2024-07-26: qty 10

## 2024-07-26 MED ORDER — PHENYLEPHRINE 80 MCG/ML (10ML) SYRINGE FOR IV PUSH (FOR BLOOD PRESSURE SUPPORT)
PREFILLED_SYRINGE | INTRAVENOUS | Status: DC | PRN
  Administered 2024-07-26 (×2): 80 ug via INTRAVENOUS

## 2024-07-26 MED ORDER — METOPROLOL TARTRATE 5 MG/5ML IV SOLN
5.0000 mg | Freq: Four times a day (QID) | INTRAVENOUS | Status: DC
  Administered 2024-07-26 – 2024-07-28 (×7): 5 mg via INTRAVENOUS
  Filled 2024-07-26 (×7): qty 5

## 2024-07-26 MED ORDER — BUPIVACAINE HCL (PF) 0.5 % IJ SOLN
INTRAMUSCULAR | Status: AC
  Filled 2024-07-26: qty 30

## 2024-07-26 MED ORDER — ONDANSETRON HCL 4 MG/2ML IJ SOLN
INTRAMUSCULAR | Status: AC
  Filled 2024-07-26: qty 2

## 2024-07-26 MED ORDER — ONDANSETRON HCL 4 MG/2ML IJ SOLN
INTRAMUSCULAR | Status: DC | PRN
  Administered 2024-07-26: 4 mg via INTRAVENOUS

## 2024-07-26 MED ORDER — LABETALOL HCL 5 MG/ML IV SOLN
INTRAVENOUS | Status: AC
  Filled 2024-07-26: qty 4

## 2024-07-26 MED ORDER — SUCCINYLCHOLINE CHLORIDE 200 MG/10ML IV SOSY
PREFILLED_SYRINGE | INTRAVENOUS | Status: DC | PRN
  Administered 2024-07-26: 160 mg via INTRAVENOUS

## 2024-07-26 MED ORDER — ORAL CARE MOUTH RINSE: 15.0000 mL | Freq: Once | OROMUCOSAL | Status: AC

## 2024-07-26 MED ORDER — PROPOFOL 10 MG/ML IV BOLUS
INTRAVENOUS | Status: DC | PRN
  Administered 2024-07-26: 200 mg via INTRAVENOUS

## 2024-07-26 MED ORDER — HYDROMORPHONE HCL 1 MG/ML IJ SOLN
0.2500 mg | INTRAMUSCULAR | Status: DC | PRN
  Administered 2024-07-26 (×2): 0.5 mg via INTRAVENOUS
  Filled 2024-07-26 (×2): qty 0.5

## 2024-07-26 MED ORDER — CHLORHEXIDINE GLUCONATE 0.12 % MT SOLN
15.0000 mL | Freq: Once | OROMUCOSAL | Status: AC
  Administered 2024-07-26: 15 mL via OROMUCOSAL

## 2024-07-26 MED ORDER — STERILE WATER FOR IRRIGATION IR SOLN
Status: DC | PRN
  Administered 2024-07-26: 500 mL

## 2024-07-26 MED ORDER — MIDAZOLAM HCL 2 MG/2ML IJ SOLN
INTRAMUSCULAR | Status: AC
  Filled 2024-07-26: qty 2

## 2024-07-26 MED ORDER — ONDANSETRON HCL 4 MG/2ML IJ SOLN: 4.0000 mg | Freq: Once | INTRAMUSCULAR | Status: DC | PRN

## 2024-07-26 MED ORDER — OXYCODONE HCL 5 MG PO TABS: 5.0000 mg | ORAL_TABLET | Freq: Once | ORAL | Status: DC | PRN

## 2024-07-26 MED ORDER — FENTANYL CITRATE (PF) 100 MCG/2ML IJ SOLN
INTRAMUSCULAR | Status: DC | PRN
  Administered 2024-07-26: 50 ug via INTRAVENOUS
  Administered 2024-07-26 (×2): 100 ug via INTRAVENOUS

## 2024-07-26 MED ORDER — BUPIVACAINE HCL (PF) 0.5 % IJ SOLN
INTRAMUSCULAR | Status: DC | PRN
  Administered 2024-07-26: 30 mL

## 2024-07-26 MED ORDER — LIDOCAINE 2% (20 MG/ML) 5 ML SYRINGE
INTRAMUSCULAR | Status: AC
  Filled 2024-07-26: qty 5

## 2024-07-26 MED ORDER — DEXAMETHASONE SOD PHOSPHATE PF 10 MG/ML IJ SOLN
INTRAMUSCULAR | Status: DC | PRN
  Administered 2024-07-26: 5 mg via INTRAVENOUS

## 2024-07-26 MED ORDER — MIDAZOLAM HCL (PF) 2 MG/2ML IJ SOLN
INTRAMUSCULAR | Status: DC | PRN
  Administered 2024-07-26: 2 mg via INTRAVENOUS

## 2024-07-26 MED ORDER — KETAMINE HCL 50 MG/5ML IJ SOSY
PREFILLED_SYRINGE | INTRAMUSCULAR | Status: AC
  Filled 2024-07-26: qty 5

## 2024-07-26 MED ORDER — HYDROMORPHONE HCL 1 MG/ML IJ SOLN
INTRAMUSCULAR | Status: AC
  Filled 2024-07-26: qty 0.5

## 2024-07-26 MED ORDER — DEXMEDETOMIDINE HCL IN NACL 80 MCG/20ML IV SOLN
INTRAVENOUS | Status: AC
  Filled 2024-07-26: qty 20

## 2024-07-26 MED ORDER — LABETALOL HCL 5 MG/ML IV SOLN
INTRAVENOUS | Status: DC | PRN
  Administered 2024-07-26 (×2): 5 mg via INTRAVENOUS

## 2024-07-26 MED ORDER — KETOROLAC TROMETHAMINE 30 MG/ML IJ SOLN
INTRAMUSCULAR | Status: DC | PRN
  Administered 2024-07-26: 30 mg via INTRAVENOUS

## 2024-07-26 MED ORDER — OXYCODONE HCL 5 MG/5ML PO SOLN: 5.0000 mg | Freq: Once | ORAL | Status: DC | PRN

## 2024-07-26 MED ORDER — DEXMEDETOMIDINE HCL IN NACL 80 MCG/20ML IV SOLN
INTRAVENOUS | Status: DC | PRN
  Administered 2024-07-26: 8 ug via INTRAVENOUS

## 2024-07-26 MED ORDER — METHOCARBAMOL 500 MG PO TABS
500.0000 mg | ORAL_TABLET | Freq: Four times a day (QID) | ORAL | Status: DC | PRN
  Administered 2024-07-26 – 2024-07-27 (×2): 500 mg via ORAL
  Filled 2024-07-26 (×2): qty 1

## 2024-07-26 MED ORDER — HYDROMORPHONE HCL 1 MG/ML IJ SOLN
INTRAMUSCULAR | Status: DC | PRN
  Administered 2024-07-26 (×3): .5 mg via INTRAVENOUS

## 2024-07-26 MED ORDER — SUGAMMADEX SODIUM 200 MG/2ML IV SOLN
INTRAVENOUS | Status: DC | PRN
  Administered 2024-07-26: 200 mg via INTRAVENOUS

## 2024-07-26 MED ORDER — KETAMINE HCL 50 MG/5ML IJ SOSY
PREFILLED_SYRINGE | INTRAMUSCULAR | Status: DC | PRN
  Administered 2024-07-26: 30 mg via INTRAVENOUS
  Administered 2024-07-26: 20 mg via INTRAVENOUS

## 2024-07-26 MED ORDER — ENALAPRILAT 1.25 MG/ML IV SOLN
1.2500 mg | Freq: Four times a day (QID) | INTRAVENOUS | Status: DC
  Administered 2024-07-26 – 2024-07-28 (×8): 1.25 mg via INTRAVENOUS
  Filled 2024-07-26 (×11): qty 1

## 2024-07-26 MED ORDER — LIDOCAINE 2% (20 MG/ML) 5 ML SYRINGE
INTRAMUSCULAR | Status: DC | PRN
  Administered 2024-07-26: 60 mg via INTRAVENOUS

## 2024-07-26 MED ORDER — FENTANYL CITRATE (PF) 250 MCG/5ML IJ SOLN
INTRAMUSCULAR | Status: AC
  Filled 2024-07-26: qty 5

## 2024-07-26 MED ORDER — SUCCINYLCHOLINE CHLORIDE 200 MG/10ML IV SOSY
PREFILLED_SYRINGE | INTRAVENOUS | Status: AC
  Filled 2024-07-26: qty 10

## 2024-07-26 MED ORDER — LACTATED RINGERS IV SOLN: INTRAVENOUS | Status: DC

## 2024-07-26 MED ORDER — ROCURONIUM BROMIDE 10 MG/ML (PF) SYRINGE
PREFILLED_SYRINGE | INTRAVENOUS | Status: DC | PRN
  Administered 2024-07-26: 20 mg via INTRAVENOUS
  Administered 2024-07-26: 60 mg via INTRAVENOUS
  Administered 2024-07-26: 20 mg via INTRAVENOUS
  Administered 2024-07-26: 30 mg via INTRAVENOUS
  Administered 2024-07-26: 20 mg via INTRAVENOUS

## 2024-07-26 SURGICAL SUPPLY — 37 items
BINDER ABD UNIV 12 45-62 (WOUND CARE) IMPLANT
CHLORAPREP W/TINT 26 (MISCELLANEOUS) ×2 IMPLANT
COVER LIGHT HANDLE STERIS (MISCELLANEOUS) ×4 IMPLANT
COVER MAYO STAND XLG (MISCELLANEOUS) ×2 IMPLANT
COVER TIP SHEARS 8 DVNC (MISCELLANEOUS) ×2 IMPLANT
DERMABOND ADVANCED .7 DNX12 (GAUZE/BANDAGES/DRESSINGS) ×2 IMPLANT
DRAPE ARM DVNC X/XI (DISPOSABLE) ×6 IMPLANT
DRAPE COLUMN DVNC XI (DISPOSABLE) ×2 IMPLANT
DRIVER NDL MEGA SUTCUT DVNCXI (INSTRUMENTS) ×2 IMPLANT
ELECTRODE REM PT RTRN 9FT ADLT (ELECTROSURGICAL) ×2 IMPLANT
FORCEPS BPLR R/ABLATION 8 DVNC (INSTRUMENTS) ×2 IMPLANT
GLOVE BIOGEL PI IND STRL 7.0 (GLOVE) ×8 IMPLANT
GLOVE SURG SS PI 7.5 STRL IVOR (GLOVE) ×4 IMPLANT
GOWN STRL REUS W/TWL LRG LVL3 (GOWN DISPOSABLE) ×6 IMPLANT
GRASPER TIP-UP FEN DVNC XI (INSTRUMENTS) IMPLANT
IRRIGATOR SUCT 8 DISP DVNC XI (IRRIGATION / IRRIGATOR) IMPLANT
KIT TURNOVER KIT A (KITS) ×2 IMPLANT
MESH VENTRALIGHT ST 4.5IN (Mesh General) IMPLANT
NDL HYPO 21X1.5 SAFETY (NEEDLE) ×2 IMPLANT
NDL INSUFFLATION 14GA 120MM (NEEDLE) ×2 IMPLANT
OBTURATOR OPTICALSTD 8 DVNC (TROCAR) ×2 IMPLANT
PACK LAP CHOLE LZT030E (CUSTOM PROCEDURE TRAY) ×2 IMPLANT
PAD ARMBOARD POSITIONER FOAM (MISCELLANEOUS) ×2 IMPLANT
PENCIL HANDSWITCHING (ELECTRODE) ×2 IMPLANT
POSITIONER HEAD 8X9X4 ADT (SOFTGOODS) ×2 IMPLANT
SCISSORS MNPLR CVD DVNC XI (INSTRUMENTS) ×2 IMPLANT
SEAL UNIV 5-12 XI (MISCELLANEOUS) ×6 IMPLANT
SEALER VESSEL EXT DVNC XI (MISCELLANEOUS) IMPLANT
SET BASIN LINEN APH (SET/KITS/TRAYS/PACK) ×2 IMPLANT
SET TUBE DA VINCI INSUFFLATOR (TUBING) IMPLANT
SUT MNCRL AB 4-0 PS2 18 (SUTURE) ×4 IMPLANT
SUT STRATA 3-0 SH (SUTURE) ×4 IMPLANT
SUTURE STRATFX 0 PDS+ CT-2 23 (SUTURE) ×2 IMPLANT
SYR 30ML LL (SYRINGE) ×2 IMPLANT
TAPE TRANSPORE STRL 2 31045 (GAUZE/BANDAGES/DRESSINGS) ×4 IMPLANT
TRAY FOLEY W/BAG SLVR 16FR ST (SET/KITS/TRAYS/PACK) ×2 IMPLANT
WATER STERILE IRR 500ML POUR (IV SOLUTION) ×2 IMPLANT

## 2024-07-26 NOTE — Interval H&P Note (Signed)
 History and Physical Interval Note:  07/26/2024 11:18 AM  Rodney Valdez  has presented today for surgery, with the diagnosis of Recurrent incarcerated ventral hernia.  The various methods of treatment have been discussed with the patient and family. After consideration of risks, benefits and other options for treatment, the patient has consented to  Procedure(s): REPAIR, HERNIA, VENTRAL, ROBOT-ASSISTED (N/A) as a surgical intervention.  The patient's history has been reviewed, patient examined, no change in status, stable for surgery.  I have reviewed the patient's chart and labs.  Questions were answered to the patient's satisfaction.     Oneil Budge

## 2024-07-26 NOTE — Discharge Instructions (Signed)

## 2024-07-26 NOTE — Progress Notes (Signed)
 No PCP listed. LCSW added PCP list to AVS.    07/26/24 0802  TOC Brief Assessment  Insurance and Status Reviewed  Patient has primary care physician No (Added PCP list to AVS.)  Home environment has been reviewed Lives with significant other.  Prior level of function: Independent.  Prior/Current Home Services No current home services  Social Drivers of Health Review SDOH reviewed no interventions necessary  Readmission risk has been reviewed Yes  Transition of care needs no transition of care needs at this time

## 2024-07-26 NOTE — Anesthesia Postprocedure Evaluation (Signed)
 Anesthesia Post Note  Patient: Finneas A Bury  Procedure(s) Performed: REPAIR, HERNIA, VENTRAL, ROBOT-ASSISTED (Abdomen) LYSIS, ADHESIONS, ROBOT-ASSISTED, LAPAROSCOPIC (Abdomen)  Patient location during evaluation: PACU Anesthesia Type: General Level of consciousness: awake and alert Pain management: pain level controlled Vital Signs Assessment: post-procedure vital signs reviewed and stable Respiratory status: spontaneous breathing, nonlabored ventilation, respiratory function stable and patient connected to nasal cannula oxygen Cardiovascular status: blood pressure returned to baseline and stable Postop Assessment: no apparent nausea or vomiting Anesthetic complications: no   No notable events documented.   Last Vitals:  Vitals:   07/26/24 1427 07/26/24 1430  BP: (!) 130/90 (!) 119/94  Pulse: 86 86  Resp: (!) 23 20  Temp: 37.4 C   SpO2: 97% 97%    Last Pain:  Vitals:   07/26/24 1427  TempSrc:   PainSc: 0-No pain                 Andrea Limes

## 2024-07-26 NOTE — Anesthesia Procedure Notes (Signed)
 Procedure Name: Intubation Date/Time: 07/26/2024 12:10 PM  Performed by: Elaine Delon CROME, CRNAPre-anesthesia Checklist: Patient identified, Emergency Drugs available, Suction available and Patient being monitored Patient Re-evaluated:Patient Re-evaluated prior to induction Oxygen Delivery Method: Circle system utilized Preoxygenation: Pre-oxygenation with 100% oxygen Induction Type: IV induction and Rapid sequence Laryngoscope Size: Mac and 4 Grade View: Grade I Tube type: Oral Tube size: 7.5 mm Number of attempts: 1 Airway Equipment and Method: Stylet and Oral airway Placement Confirmation: ETT inserted through vocal cords under direct vision, positive ETCO2 and breath sounds checked- equal and bilateral Secured at: 22 cm Tube secured with: Tape Dental Injury: Teeth and Oropharynx as per pre-operative assessment

## 2024-07-26 NOTE — Anesthesia Preprocedure Evaluation (Addendum)
 Anesthesia Evaluation  Patient identified by MRN, date of birth, ID band Patient awake    Reviewed: Allergy & Precautions, H&P , NPO status , Patient's Chart, lab work & pertinent test results  Airway Mallampati: III  TM Distance: >3 FB Neck ROM: Full    Dental  (+) Poor Dentition, Chipped, Missing   Pulmonary former smoker   Pulmonary exam normal breath sounds clear to auscultation       Cardiovascular hypertension, Normal cardiovascular exam Rhythm:Regular Rate:Normal     Neuro/Psych  Headaches  negative psych ROS   GI/Hepatic negative GI ROS, Neg liver ROS,,,  Endo/Other  negative endocrine ROS    Renal/GU negative Renal ROS  negative genitourinary   Musculoskeletal negative musculoskeletal ROS (+)    Abdominal   Peds negative pediatric ROS (+)  Hematology  (+) Blood dyscrasia, anemia   Anesthesia Other Findings Febrile at 99.6  Reproductive/Obstetrics negative OB ROS                              Anesthesia Physical Anesthesia Plan  ASA: 2 and emergent  Anesthesia Plan: General   Post-op Pain Management:    Induction: Intravenous  PONV Risk Score and Plan:   Airway Management Planned: Oral ETT  Additional Equipment:   Intra-op Plan:   Post-operative Plan: Extubation in OR  Informed Consent: I have reviewed the patients History and Physical, chart, labs and discussed the procedure including the risks, benefits and alternatives for the proposed anesthesia with the patient or authorized representative who has indicated his/her understanding and acceptance.     Dental advisory given  Plan Discussed with: CRNA  Anesthesia Plan Comments:          Anesthesia Quick Evaluation

## 2024-07-26 NOTE — Transfer of Care (Signed)
 Immediate Anesthesia Transfer of Care Note  Patient: Rodney Valdez  Procedure(s) Performed: REPAIR, HERNIA, VENTRAL, ROBOT-ASSISTED (Abdomen) LYSIS, ADHESIONS, ROBOT-ASSISTED, LAPAROSCOPIC (Abdomen)  Patient Location: PACU  Anesthesia Type:General  Level of Consciousness: awake  Airway & Oxygen Therapy: Patient Spontanous Breathing and Patient connected to nasal cannula oxygen  Post-op Assessment: Report given to RN and Post -op Vital signs reviewed and stable  Post vital signs: Reviewed and stable  Last Vitals:  Vitals Value Taken Time  BP 130/90 07/26/24 14:27  Temp 99.4   Pulse 79 07/26/24 14:28  Resp 15 07/26/24 14:28  SpO2 97 % 07/26/24 14:28  Vitals shown include unfiled device data.  Last Pain:  Vitals:   07/26/24 1110  TempSrc:   PainSc: 0-No pain      Patients Stated Pain Goal: 3 (07/26/24 0150)  Complications: No notable events documented.

## 2024-07-26 NOTE — Op Note (Signed)
 Patient:  Rodney Valdez  DOB:  1981-03-09  MRN:  [id number]   Preop Diagnosis: Incarcerated recurrent ventral hernia  Postop Diagnosis: Same  Procedure: Robotic assisted laparoscopic recurrent incisional herniorrhaphy with mesh  Surgeon: Oneil Budge, MD  Anes: General Endotracheal  Indications: Patient is a 43 year old white male status post periumbilical hernia x 2 who presented to the emergency room with incarceration of the periumbilical hernia.  This was partially reduced.  He now presents for a robotic assisted laparoscopic recurrent ventral herniorrhaphy with mesh.  The risks and benefits of the procedure including bleeding, infection, mesh use, bowel injury, the possibility recurrence of the hernia, and the possibility of an open procedure were fully explained to the patient, who gave informed consent.  Procedure note: The patient was placed in the supine position.  After induction of general endotracheal anesthesia, the abdomen was prepped and draped using the usual sterile technique with ChloraPrep.  Surgical site confirmation was performed.  An incision was made in the left upper quadrant at Palmer's point.  A Veress needle was introduced into the abdominal cavity and confirmation of placement was done using the saline drop test.  The abdomen was then insufflated to 15 mmHg pressure.  The robot was then docked and targeted.  An 8 mm trocar was introduced into the abdominal cavity under direct visualization without difficulty.  An additional 8 mm trocar was placed to the left flank and left lower quadrant regions.  The patient was noted to have an incarcerated umbilical and infraumbilical hernia.  Previously placed mesh was in place but the hernia was inferior to the mesh.  I carefully had to reduce the small bowel intestinal contents that were in the hernia.  This was done both sharply and bluntly.  Once the bowel was reduced, it was inspected and no abnormal bleeding was noted.  There  appeared to be possibly a serosal tear of the small bowel and this was repaired using a 3-0 STRATAFIX running suture.  The hernia defect measured approximately 5 cm in greatest diameter.  It was closed longitudinally using an 0 STRATAFIX running suture.  An 11 cm round Bard Ventralight DualMesh was then inserted and secured to the abdominal wall using 3-0 STRATAFIX running sutures.  The bowel was again inspected and no injury was noted.  No active bleeding was noted.  The robot was undocked and all air was evacuated from the abdominal cavity prior to the removal of the trocars.  All wounds were irrigated with normal saline.  All wounds were injected with 0.5% Sensorcaine.  All incisions were closed using a 4-0 Monocryl subcuticular suture.  Dermabond was applied.  All tape and needle counts were correct at the end of the procedure.  The patient was extubated in the operating room and transferred to PACU in stable condition.  Complications: None  EBL: 200 cc  Specimen: None

## 2024-07-26 NOTE — Progress Notes (Signed)
 Transported off unit at this time to Pre-Op.  Transport method: stretcher  Escorted by: Surgical staff

## 2024-07-26 NOTE — Progress Notes (Signed)
 Arrived back to unit after PACU.

## 2024-07-26 NOTE — Plan of Care (Signed)
 OR today.  Family at bedside.  Updated on plan of care.   Problem: Education: Goal: Knowledge of General Education information will improve Description: Including pain rating scale, medication(s)/side effects and non-pharmacologic comfort measures Outcome: Progressing   Problem: Health Behavior/Discharge Planning: Goal: Ability to manage health-related needs will improve Outcome: Progressing   Problem: Clinical Measurements: Goal: Ability to maintain clinical measurements within normal limits will improve Outcome: Progressing Goal: Will remain free from infection Outcome: Progressing Goal: Diagnostic test results will improve Outcome: Progressing

## 2024-07-26 NOTE — Progress Notes (Signed)
 Vitals obtained after arrival from PACU.  Updated patient on plan of care.  Visitor at bedside updated as well.    07/26/24 1552  Vitals  Temp 99.9 F (37.7 C)  Temp Source Oral  BP (!) 151/99  MAP (mmHg) 114  BP Location Right Arm  BP Method Automatic  Patient Position (if appropriate) Lying  Pulse Rate 80  Pulse Rate Source Dinamap  Resp 18  MEWS COLOR  MEWS Score Color Green  Oxygen Therapy  SpO2 92 %  O2 Device Room Air  MEWS Score  MEWS Temp 0  MEWS Systolic 0  MEWS Pulse 0  MEWS RR 0  MEWS LOC 0  MEWS Score 0

## 2024-07-27 ENCOUNTER — Encounter (HOSPITAL_COMMUNITY): Payer: Self-pay | Admitting: General Surgery

## 2024-07-27 LAB — CBC
HCT: 45.1 % (ref 39.0–52.0)
Hemoglobin: 15.4 g/dL (ref 13.0–17.0)
MCH: 28.6 pg (ref 26.0–34.0)
MCHC: 34.1 g/dL (ref 30.0–36.0)
MCV: 83.7 fL (ref 80.0–100.0)
Platelets: 252 K/uL (ref 150–400)
RBC: 5.39 MIL/uL (ref 4.22–5.81)
RDW: 13.7 % (ref 11.5–15.5)
WBC: 12.2 K/uL — ABNORMAL HIGH (ref 4.0–10.5)
nRBC: 0 % (ref 0.0–0.2)

## 2024-07-27 LAB — BASIC METABOLIC PANEL WITH GFR
Anion gap: 10 (ref 5–15)
BUN: 15 mg/dL (ref 6–20)
CO2: 29 mmol/L (ref 22–32)
Calcium: 9.1 mg/dL (ref 8.9–10.3)
Chloride: 99 mmol/L (ref 98–111)
Creatinine, Ser: 0.75 mg/dL (ref 0.61–1.24)
GFR, Estimated: >60 mL/min (ref >60)
Glucose, Bld: 118 mg/dL — ABNORMAL HIGH (ref 70–99)
Potassium: 3.8 mmol/L (ref 3.5–5.1)
Sodium: 138 mmol/L (ref 135–145)

## 2024-07-27 MED ORDER — MAGNESIUM HYDROXIDE 400 MG/5ML PO SUSP: 30.0000 mL | Freq: Four times a day (QID) | ORAL | Status: DC | PRN

## 2024-07-27 MED ORDER — HYDROMORPHONE HCL 1 MG/ML IJ SOLN
1.0000 mg | INTRAMUSCULAR | Status: DC | PRN
  Administered 2024-07-27 (×3): 1 mg via INTRAVENOUS
  Filled 2024-07-27 (×3): qty 1

## 2024-07-27 MED ORDER — GUAIFENESIN-DM 100-10 MG/5ML PO SYRP
5.0000 mL | ORAL_SOLUTION | ORAL | Status: DC | PRN
  Administered 2024-07-27 – 2024-07-28 (×3): 5 mL via ORAL
  Filled 2024-07-27 (×3): qty 5

## 2024-07-27 MED ORDER — METHOCARBAMOL 500 MG PO TABS
500.0000 mg | ORAL_TABLET | Freq: Four times a day (QID) | ORAL | Status: DC
  Administered 2024-07-27 – 2024-07-28 (×4): 500 mg via ORAL
  Filled 2024-07-27 (×4): qty 1

## 2024-07-27 NOTE — Plan of Care (Signed)

## 2024-07-27 NOTE — Progress Notes (Signed)
 Mobility Specialist Progress Note:    07/27/24 1356  Mobility  Activity Ambulated with assistance  Level of Assistance Independent  Assistive Device None  Distance Ambulated (ft) 600 ft  Range of Motion/Exercises Active;All extremities  Activity Response Tolerated well  Mobility Referral Yes  Mobility visit 1 Mobility  Mobility Specialist Start Time (ACUTE ONLY) 1356  Mobility Specialist Stop Time (ACUTE ONLY) 1416  Mobility Specialist Time Calculation (min) (ACUTE ONLY) 20 min   Pt received in bed,agreeable to mobility. Independently able to stand and ambulate with no AD. Tolerated well, asx throughout. Left sitting EOB, all needs met.  Jaylin Benzel Mobility Specialist Please contact via Special Educational Needs Teacher or  Rehab office at [phone]

## 2024-07-27 NOTE — Progress Notes (Signed)
 1 Day Post-Op  Subjective: Has periumbilical abdominal wall pain.  No significant incisional pain.  He is not nauseated this morning, though his appetite is decreased.  Objective: Vital signs in last 24 hours: Temp:  [98.6 F (37 C)-100.5 F (38.1 C)] 100 F (37.8 C) (12/03 0523) Pulse Rate:  [73-90] 85 (12/03 0523) Resp:  [8-23] 20 (12/03 0523) BP: (119-166)/(90-112) 159/112 (12/03 0523) SpO2:  [90 %-98 %] 90 % (12/03 0523) Last BM Date : 07/24/24  Intake/Output from previous day: 12/02 0701 - 12/03 0700 In: 1893.9 [I.V.:1793.9; IV Piggyback:100] Out: 200 [Blood:200] Intake/Output this shift: No intake/output data recorded.  General appearance: alert, cooperative, and no distress Resp: clear to auscultation bilaterally Cardio: regular rate and rhythm, S1, S2 normal, no murmur, click, rub or gallop GI: Soft, incisions healing well.  No ecchymosis present.  Lab Results:  Recent Labs    07/25/24 1156 07/27/24 0426  WBC 9.2 12.2*  HGB 16.3 15.4  HCT 48.0 45.1  PLT 255 252   BMET Recent Labs    07/25/24 1156 07/27/24 0426  NA 137 138  K 3.9 3.8  CL 99 99  CO2 25 29  GLUCOSE 131* 118*  BUN 13 15  CREATININE 0.83 0.75  CALCIUM 9.7 9.1   PT/INR No results for input(s): LABPROT, INR in the last 72 hours.  Studies/Results: CT ABDOMEN PELVIS W CONTRAST Result Date: 07/25/2024 CLINICAL DATA:  umbilical hernia. Difficult to reduce. Eval for inflammatory changes/bowel obstruction EXAM: CT ABDOMEN AND PELVIS WITH CONTRAST TECHNIQUE: Multidetector CT imaging of the abdomen and pelvis was performed using the standard protocol following bolus administration of intravenous contrast. RADIATION DOSE REDUCTION: This exam was performed according to the departmental dose-optimization program which includes automated exposure control, adjustment of the mA and/or kV according to patient size and/or use of iterative reconstruction technique. CONTRAST:  OMNIPAQUE  IOHEXOL  300  MG/ML  SOLN COMPARISON:  January 26, 2024 FINDINGS: Lower chest: No focal airspace consolidation or pleural effusion.Posterior bibasilar dependent atelectasis. Hepatobiliary: No mass.Focal fatty infiltration along the falciform ligament.Single small radiopaque gallstone. No gallbladder wall thickening. No intrahepatic or extrahepatic biliary ductal dilation. The portal veins are patent. Pancreas: No mass or main ductal dilation. No peripancreatic inflammation or fluid collection. Spleen: Normal size. No mass. Adrenals/Urinary Tract: No adrenal masses. No renal mass. No nephrolithiasis or hydronephrosis. The urinary bladder is distended without focal abnormality. Stomach/Bowel: The stomach contains ingested material without focal abnormality. A couple of dilated segments of small bowel present in the left hemiabdomen measuring up to 3.5 cm in transverse dimension with adjacent mesenteric edema. There is a transition point along the leftward aspect of the umbilical hernia sac (axial 53, coronal 50). A dilated segment of small bowel with small bowel feces sign is also present in the umbilical hernia sac. The small bowel exiting the hernia sac is completely decompressed.Appendectomy. Descending and sigmoid colonic diverticulosis. No changes of acute diverticulitis. Vascular/Lymphatic: No aortic aneurysm. Scattered aortoiliac atherosclerosis. No intraabdominal or pelvic lymphadenopathy. Reproductive: No prostatomegaly.Small volume free fluid in the pelvis. Other: No pneumoperitoneum.  No portal venous gas. Musculoskeletal: No acute fracture or destructive lesion. Multilevel thoracic osteophytosis. Large, multi lobular umbilical hernia again noted, measuring 8.3 by 18.4 by 10.9 cm with a 4.1 cm neck. IMPRESSION: 1. Redemonstrated, large umbilical hernia containing intra-abdominal fat and multiple segments of small bowel, as measured above. Findings consistent with a superimposed partial or early high-grade small bowel  obstruction are also present. Surgical consultation recommended 2. Small volume free fluid  in the pelvis, likely reactive. 3. Scattered descending and sigmoid colonic diverticulosis. No changes of acute diverticulitis. 4. Single small radiopaque gallstone. Aortic Atherosclerosis (ICD10-I70.0). Electronically Signed   By: Rogelia Myers M.D.   On: 07/25/2024 15:34    Anti-infectives: Anti-infectives (From admission, onward)    Start     Dose/Rate Route Frequency Ordered Stop   07/26/24 1100  ceFAZolin (ANCEF) IVPB 2g/100 mL premix        2 g 200 mL/hr over 30 Minutes Intravenous On call to O.R. 07/25/24 1636 07/26/24 1245       Assessment/Plan: s/p Procedure(s): REPAIR, HERNIA, VENTRAL, ROBOT-ASSISTED LYSIS, ADHESIONS, ROBOT-ASSISTED, LAPAROSCOPIC Impression: Stable on postoperative day 1.  His bowel function is starting to return.  He has passed flatus has not had a bowel movement yet.  His blood pressure is still elevated and we will continue using metoprolol and enalapril.  He understands that he needs to get a primary care physician.  His primary care physician recently retired.  He is trying to get in with Dr. Shona.  Continue to monitor his pain level.  Anticipate discharge in next 24 to 48 hours.  LOS: 1 day    Rodney Valdez 07/27/2024

## 2024-07-28 LAB — BASIC METABOLIC PANEL WITH GFR
Anion gap: 13 (ref 5–15)
BUN: 15 mg/dL (ref 6–20)
CO2: 25 mmol/L (ref 22–32)
Calcium: 8.8 mg/dL — ABNORMAL LOW (ref 8.9–10.3)
Chloride: 100 mmol/L (ref 98–111)
Creatinine, Ser: 0.73 mg/dL (ref 0.61–1.24)
GFR, Estimated: >60 mL/min (ref >60)
Glucose, Bld: 95 mg/dL (ref 70–99)
Potassium: 3.4 mmol/L — ABNORMAL LOW (ref 3.5–5.1)
Sodium: 137 mmol/L (ref 135–145)

## 2024-07-28 LAB — CBC
HCT: 44.1 % (ref 39.0–52.0)
Hemoglobin: 15 g/dL (ref 13.0–17.0)
MCH: 28.7 pg (ref 26.0–34.0)
MCHC: 34 g/dL (ref 30.0–36.0)
MCV: 84.3 fL (ref 80.0–100.0)
Platelets: 249 K/uL (ref 150–400)
RBC: 5.23 MIL/uL (ref 4.22–5.81)
RDW: 13.7 % (ref 11.5–15.5)
WBC: 12 K/uL — ABNORMAL HIGH (ref 4.0–10.5)
nRBC: 0 % (ref 0.0–0.2)

## 2024-07-28 MED ORDER — METHOCARBAMOL 500 MG PO TABS: 500.0000 mg | ORAL_TABLET | Freq: Four times a day (QID) | ORAL | 1 refills | Status: DC

## 2024-07-28 MED ORDER — OXYCODONE HCL 5 MG PO TABS: 5.0000 mg | ORAL_TABLET | ORAL | 0 refills | Status: DC | PRN

## 2024-07-28 MED ORDER — ENALAPRIL MALEATE 10 MG PO TABS
20.0000 mg | ORAL_TABLET | Freq: Two times a day (BID) | ORAL | Status: DC
  Administered 2024-07-28: 20 mg via ORAL
  Filled 2024-07-28: qty 2

## 2024-07-28 MED ORDER — METOPROLOL TARTRATE 50 MG PO TABS
50.0000 mg | ORAL_TABLET | Freq: Two times a day (BID) | ORAL | Status: DC
  Administered 2024-07-28: 50 mg via ORAL
  Filled 2024-07-28: qty 1

## 2024-07-28 MED ORDER — ENALAPRIL MALEATE 20 MG PO TABS: 20.0000 mg | ORAL_TABLET | Freq: Two times a day (BID) | ORAL | 1 refills | Status: AC

## 2024-07-28 MED ORDER — METOPROLOL TARTRATE 50 MG PO TABS: 50.0000 mg | ORAL_TABLET | Freq: Two times a day (BID) | ORAL | 1 refills | Status: AC

## 2024-07-28 NOTE — Progress Notes (Signed)
 07/28/2024 11:14 AM     EXPEDITER PROGRESS NOTE  Patient Name: Rodney Valdez  DOB:1980-10-08 Date of Admission: 07/25/2024  Date of Assessment:07/28/24   -------------------------------------------------------------------------------------------------------------------   Brief clinical summary: Admitted 07/25/2024 Recurrent incisional hernia. S/P Robotic assisted laparoscopic recurrent incisional herniorrhaphy with mesh on 07/26/2024.  Labs, test, and orders reviewed: yes  30-day Readmission: No  Discharge order: No  Current discharge plan: home with significant other. Needs PCP.  If anticipated for next day discharge, barriers to discharge before 11am identified:  No immediate barriers noted at this time pending patient clinical improvement and progression.   Intervention provided by [facility] team:  none indicated at this time.   Barrier resolved: not applicable    -------------------------------------------------------------------------------------------------------------------  [employer] RN Expediter, Aidynn Krenn, Corean Lobstein Please contact us  directly via secure chat (search for [facility]) or by calling us  at [phone] [facility]).

## 2024-07-28 NOTE — Plan of Care (Signed)

## 2024-07-28 NOTE — Discharge Summary (Signed)
 Physician Discharge Summary  Patient ID: Rodney Valdez MRN: [id number] DOB/AGE: 1981/02/12 43 y.o.  Admit date: 07/25/2024 Discharge date: 07/28/2024  Admission Diagnoses: Incarcerated recurrent incisional hernia  Discharge Diagnoses: Same Principal Problem:   Recurrent incisional hernia Malignant hypertension  Discharged Condition: good  Hospital Course: Patient is a 43 year old white male who presented to the emergency room on 07/25/2024 with worsening abdominal pain and nausea secondary to an incarcerated ventral hernia.  He has had multiple hernia repairs in the past around the umbilicus.  This was partially reduced in the emergency room.  He subsequently underwent a robotic assisted laparoscopic recurrent ventral herniorrhaphy with mesh on 07/26/2024.  His postoperative course was remarkable for significant hypertension which was treated perioperatively with metoprolol and enalapril.  He states he had previously been on antihypertensive medications but had stopped them.  He is switching primary care physicians.  His blood pressure still remains elevated but seems to be improving.  His pain level has improved.  He is being discharged home on 07/28/2024 in good and improving condition.  He will be contacting his primary care physician for follow-up of his hypertension.  Treatments: surgery: Robotic assisted laparoscopic recurrent incisional herniorrhaphy with mesh on 07/26/2024  Discharge Exam: Blood pressure (!) 146/101, pulse 85, temperature 99.4 F (37.4 C), temperature source Oral, resp. rate 17, height 5' 7 (1.702 m), weight 99.8 kg, SpO2 91%. General appearance: alert, cooperative, and no distress Resp: clear to auscultation bilaterally Cardio: regular rate and rhythm, S1, S2 normal, no murmur, click, rub or gallop GI: Soft, incisions healing well.  Disposition: Discharge disposition: 01-Home or Self Care       Discharge Instructions     Diet - low sodium heart healthy    Complete by: As directed    Increase activity slowly   Complete by: As directed       Allergies as of 07/28/2024   No Known Allergies      Medication List     TAKE these medications    acetaminophen  500 MG tablet Commonly known as: TYLENOL  Take 500 mg by mouth every 6 (six) hours as needed for mild pain (pain score 1-3) or moderate pain (pain score 4-6).   enalapril 20 MG tablet Commonly known as: VASOTEC Take 1 tablet (20 mg total) by mouth 2 (two) times daily.   methocarbamol  500 MG tablet Commonly known as: ROBAXIN  Take 1 tablet (500 mg total) by mouth 4 (four) times daily.   metoprolol tartrate 50 MG tablet Commonly known as: LOPRESSOR Take 1 tablet (50 mg total) by mouth 2 (two) times daily.   naproxen sodium 220 MG tablet Commonly known as: ALEVE Take 440 mg by mouth daily as needed (for pain).   oxyCODONE  5 MG immediate release tablet Commonly known as: Roxicodone  Take 1 tablet (5 mg total) by mouth every 4 (four) hours as needed.        Follow-up Information     Mavis Anes, MD. Schedule an appointment as soon as possible for a visit on 08/04/2024.   Specialty: General Surgery Contact information: [id number] [place] [address] [phone]                 Signed: Anes Mavis 07/28/2024, 11:25 AM

## 2024-07-28 NOTE — Progress Notes (Signed)
 Patients discharge has been taught Iv has been removed Telemetry has been placed in correct spot. Patient has opted to walk out at time of discharge to the main entrance with his significant other.

## 2024-08-04 ENCOUNTER — Encounter: Payer: Self-pay | Admitting: General Surgery

## 2024-08-04 ENCOUNTER — Ambulatory Visit: Admitting: General Surgery

## 2024-08-04 ENCOUNTER — Encounter: Payer: Self-pay | Admitting: *Deleted

## 2024-08-04 VITALS — BP 116/79 | HR 54 | Temp 97.2°F | Resp 18 | Ht 67.0 in | Wt 224.0 lb

## 2024-08-04 DIAGNOSIS — Z9889 Other specified postprocedural states: Secondary | ICD-10-CM | POA: Diagnosis not present

## 2024-08-04 DIAGNOSIS — Z8719 Personal history of other diseases of the digestive system: Secondary | ICD-10-CM

## 2024-08-04 NOTE — Progress Notes (Signed)
 Subjective:     Rodney Valdez  Patient here for postoperative visit, status post robotic assisted laparoscopic recurrent ventral herniorrhaphy with mesh.  Patient states he is doing very well.  His incisional pain has resolved.  He is not taking any pain medication.  He has avoided any significant heavy lifting. Objective:    BP 116/79   Pulse (!) 54   Temp (!) 97.2 F (36.2 C) (Oral)   Resp 18   Ht 5' 7 (1.702 m)   Wt 224 lb (101.6 kg)   SpO2 94%   BMI 35.08 kg/m   General:  alert, cooperative, and no distress  Abdomen is soft, incisions healing well.  No hematoma or seroma present.  He does have some loose skin around the umbilicus, but no seroma has formed.     Assessment:    Doing well postoperatively.    Plan:   Patient will be returning to work on 08/08/2024.  He will limit his heavy lifting, but I told him he may increase his activity as able.  Follow-up here as needed.

## 2024-08-20 ENCOUNTER — Other Ambulatory Visit: Payer: Self-pay | Admitting: General Surgery
